# Patient Record
Sex: Male | Born: 1995 | Race: White | Hispanic: No | Marital: Single | Smoking: Never smoker
Health system: Southern US, Community
[De-identification: ages and names within clinical notes are randomized; demographics above are authoritative.]

## PROBLEM LIST (undated history)

## (undated) DIAGNOSIS — F840 Autistic disorder: Secondary | ICD-10-CM

## (undated) DIAGNOSIS — R569 Unspecified convulsions: Secondary | ICD-10-CM

## (undated) HISTORY — DX: Unspecified convulsions: R56.9

## (undated) HISTORY — DX: Autistic disorder: F84.0

---
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
## 2013-05-13 ENCOUNTER — Other Ambulatory Visit: Payer: Self-pay

## 2013-05-13 DIAGNOSIS — G40209 Localization-related (focal) (partial) symptomatic epilepsy and epileptic syndromes with complex partial seizures, not intractable, without status epilepticus: Secondary | ICD-10-CM

## 2013-05-13 DIAGNOSIS — F840 Autistic disorder: Secondary | ICD-10-CM

## 2013-05-13 DIAGNOSIS — G40309 Generalized idiopathic epilepsy and epileptic syndromes, not intractable, without status epilepticus: Secondary | ICD-10-CM

## 2013-05-13 MED ORDER — OXCARBAZEPINE 300 MG PO TABS: ORAL_TABLET | ORAL | Status: DC

## 2013-05-13 MED ORDER — ESCITALOPRAM OXALATE 10 MG PO TABS: 10.0000 mg | ORAL_TABLET | Freq: Every day | ORAL | Status: DC

## 2013-11-25 ENCOUNTER — Other Ambulatory Visit: Payer: Self-pay

## 2013-11-25 DIAGNOSIS — G40209 Localization-related (focal) (partial) symptomatic epilepsy and epileptic syndromes with complex partial seizures, not intractable, without status epilepticus: Secondary | ICD-10-CM

## 2013-11-25 DIAGNOSIS — G40309 Generalized idiopathic epilepsy and epileptic syndromes, not intractable, without status epilepticus: Secondary | ICD-10-CM

## 2013-11-25 DIAGNOSIS — F840 Autistic disorder: Secondary | ICD-10-CM

## 2013-11-25 MED ORDER — ESCITALOPRAM OXALATE 10 MG PO TABS: 10.0000 mg | ORAL_TABLET | Freq: Every day | ORAL | Status: DC

## 2013-11-25 MED ORDER — TRILEPTAL 300 MG PO TABS: ORAL_TABLET | ORAL | Status: DC

## 2013-11-25 NOTE — Telephone Encounter (Signed)
Christian Preston, I do not see a recall in for this pt.

## 2013-12-11 ENCOUNTER — Encounter: Payer: Self-pay | Admitting: Family

## 2013-12-11 DIAGNOSIS — G40209 Localization-related (focal) (partial) symptomatic epilepsy and epileptic syndromes with complex partial seizures, not intractable, without status epilepticus: Secondary | ICD-10-CM

## 2013-12-11 DIAGNOSIS — F840 Autistic disorder: Secondary | ICD-10-CM

## 2013-12-11 DIAGNOSIS — G40309 Generalized idiopathic epilepsy and epileptic syndromes, not intractable, without status epilepticus: Secondary | ICD-10-CM

## 2013-12-23 ENCOUNTER — Ambulatory Visit (INDEPENDENT_AMBULATORY_CARE_PROVIDER_SITE_OTHER): Payer: 59 | Admitting: Family

## 2013-12-23 ENCOUNTER — Encounter: Payer: Self-pay | Admitting: Family

## 2013-12-23 VITALS — BP 120/80 | HR 86 | Ht 69.75 in | Wt 187.4 lb

## 2013-12-23 DIAGNOSIS — R4681 Obsessive-compulsive behavior: Secondary | ICD-10-CM | POA: Insufficient documentation

## 2013-12-23 DIAGNOSIS — R451 Restlessness and agitation: Secondary | ICD-10-CM | POA: Insufficient documentation

## 2013-12-23 DIAGNOSIS — R4589 Other symptoms and signs involving emotional state: Secondary | ICD-10-CM

## 2013-12-23 DIAGNOSIS — F429 Obsessive-compulsive disorder, unspecified: Secondary | ICD-10-CM

## 2013-12-23 DIAGNOSIS — G40209 Localization-related (focal) (partial) symptomatic epilepsy and epileptic syndromes with complex partial seizures, not intractable, without status epilepticus: Secondary | ICD-10-CM

## 2013-12-23 DIAGNOSIS — F840 Autistic disorder: Secondary | ICD-10-CM

## 2013-12-23 DIAGNOSIS — G40309 Generalized idiopathic epilepsy and epileptic syndromes, not intractable, without status epilepticus: Secondary | ICD-10-CM

## 2013-12-23 DIAGNOSIS — IMO0001 Reserved for inherently not codable concepts without codable children: Secondary | ICD-10-CM | POA: Insufficient documentation

## 2013-12-23 DIAGNOSIS — Q999 Chromosomal abnormality, unspecified: Secondary | ICD-10-CM | POA: Insufficient documentation

## 2013-12-23 DIAGNOSIS — G319 Degenerative disease of nervous system, unspecified: Secondary | ICD-10-CM

## 2013-12-23 MED ORDER — ESCITALOPRAM OXALATE 10 MG PO TABS: 10.0000 mg | ORAL_TABLET | Freq: Every day | ORAL | Status: DC

## 2013-12-23 MED ORDER — DIAZEPAM 2 MG PO TABS: ORAL_TABLET | ORAL | Status: DC

## 2013-12-23 MED ORDER — TRILEPTAL 300 MG PO TABS: ORAL_TABLET | ORAL | Status: DC

## 2013-12-23 NOTE — Progress Notes (Signed)
Patient: Christian Preston MRN: [id number] Sex: male DOB: 03/28/1996  Provider: Elveria RisingGOODPASTURE, Jerrico Covello, NP [facility]  Note type: Routine return visit  History of Present Illness: Referral Source: Dr. Loyola MastMelissa Lowe History from: Mother Chief Complaint: Seizure/Autism  Christian Preston is a 18 y.o. 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 He as last seen October 28, 2012.   His last seizure occurred in June 2001, which was a prolonged seizure. At that time he was hospitalized at [facility] and placed on Trileptal. Christian Preston has tolerated this medication well.  Christian Preston has ongoing problems with low frustration tolerance, self-directed aggression, and limited communication. Since he was last seen, he has been enrolled in a day program at [employer]. The remainder of the time he is cared for by his parents and a CAP worker.   Christian Preston has been taking Lexapro for several years and his mother wonders if it is becoming less effective. She says that sometimes he is more restless and seems anxious. Mom says that he paces and sometimes will sweat when he is restless. She said that one day recently he was extremely restless, and that after having tried everything to calm him, she gave him a dose of her own Xanax ER out of desperation. She said that he became calmer and sat down and watched a video for awhile, then was generally calmer afterwards. Overall, his mood seems good and he has not shown aggression. He has bouts of the restlessness and when he becomes upset, Mom says that nothing works to help him to relax.  Christian Preston reportedly took Prozac in the past but had problems with increase in obsessive compulsive behavior and was changed to Lexapro.   Otherwise Christian Preston has been healthy and doing  well. He has a good appetite and eats a variety of foods. He does not have problems with sleep.   Review of Systems: 12 system review was unremarkable  Past Medical History  Diagnosis Date  . Seizures   . Autistic disorder, current or active state    Hospitalizations: no, Head Injury: no, Nervous System Infections: no, Immunizations up to date: yes Past Medical History Comments: see Hx.  Surgical History No past surgical history on file.  Family History family history includes Diabetes in his paternal grandmother; Heart Problems in his paternal grandmother. Family History is otherwise negative for migraines, seizures, cognitive impairment, blindness, deafness, birth defects, chromosomal disorder, autism.  Social History History   Social History  . Marital Status: Single    Spouse Name: N/A    Number of Children: N/A  . Years of Education: N/A   Social History Main Topics  . Smoking status: Never Smoker   . Smokeless tobacco: Never Used  . Alcohol Use: No  . Drug Use: No  . Sexual Activity: No   Other Topics Concern  . None   Social History Narrative  . None   Educational level: special education School Attending:[employer] Living with:  both parents  Hobbies/Interest: Playing with balls and swimming. School comments:  Christian Preston is doing well in school.  Physical Exam BP 120/80  Pulse 86  Ht 5' 9.75" (1.772 m)  Wt 187 lb 6.4 oz (85.004 kg)  BMI 27.07 kg/m2 General: alert, well developed, well nourished, in no acute distress,  right-handed, sandy hair, hazel  eyes Head: normocephalic, no dysmorphic features Ears, Nose and Throat: resistant to examination - unable to assess Neck: supple, full range of motion, no cranial or cervical bruits Respiratory: auscultation clear Cardiovascular: no murmurs, pulses are normal Musculoskeletal: no skeletal deformities or apparent scoliosis Skin: no rashes or neurocutaneous lesions, mild facial acne  Neurologic Exam   Mental Status: alert;  poor eye contact, no  verbalization, inability to follow commands, restless. He was resistant to invasions into his space but calmed fairly easily to his mother's soothing and allowed me to perform a basic examination. Cranial Nerves: visual fields are full to  objects brought in from the periphery; extraocular movements are full and conjugate; pupils are round reactive to light; funduscopic examination shows  positive red reflex is bilaterally; symmetric facial strength; midline tongue and uvula;he turns to localize sound bilaterally. Motor: Normal functional strength, tone, and mass; good fine motor movements; cannot test pronator drift. Sensory: withdrawal x4 Coordination: no tremor on reaching for objects   Gait and Station:  broad based but stable gait and station; balance can't be adequately tested Reflexes: symmetric and diminished bilaterally; no clonus; bilateral flexor plantar responses.  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 Mom was concerned today about episodes of restlessness with no apparent trigger. After discussion with Mom, I gave her a small prescription of Diazepam 2mg  to give him 1/2 tablet when restlessness was severe, to see if that would help him to be calmer. I asked her to let me know how that worked. I do not want him sedated, but simply calmer. If the restlessness persists or becomes more frequent, we will need to consider an alternate to the Lexapro. He remains seizure free on Trileptal and we will make no change in that medication. I will see him back in follow up in 1 year or sooner if needed.

## 2013-12-23 NOTE — Patient Instructions (Signed)
Continue Trileptal and Lexapro without change. I have given Christian Preston a prescription for Diazepam 2mg  to use when he becomes very restless and upset. Give him 1/2 tablet once per day when this occurs. Let me know how this works. I do not want him to become too sleepy or sedated.  Please plan to return for follow up in 1 year or sooner if needed.

## 2013-12-24 ENCOUNTER — Encounter: Payer: Self-pay | Admitting: Family

## 2014-01-09 ENCOUNTER — Other Ambulatory Visit: Payer: Self-pay | Admitting: Family

## 2014-04-01 ENCOUNTER — Telehealth: Payer: Self-pay | Admitting: Family

## 2014-04-01 MED ORDER — DIAZEPAM 1 MG/ML PO SOLN: ORAL | Status: DC

## 2014-04-01 NOTE — Telephone Encounter (Signed)
I reviewed your note and agree with this plan. 

## 2014-04-01 NOTE — Telephone Encounter (Signed)
Mom [name] left a message about [name]. She said that she feels that it is not strong enough and that he needs a larger dose. She also said that he needs a liquid formulation. I called Mom back and she said that when he was seen in May, she felt that a tablet could be hidden in a bite of food but when he is agitated, that is not as easy to do as she thought, and she has found that crushing the tablet and putting in something so that it becomes almost liquid is easier, so she wants liquid Diazepam. She also said that the the small dose we started him on has helped but not enough. He has been taking Diazepam 1 or 2mg , and she said that sometimes he is so agitated that it takes the edge off but does not calm him enough. She has only given it to him 8 times since the Rx was given in May, but thought about giving it more. She said that sometimes Christian Preston paces, sweats, hits himself and will throw things when he is upset. He has not been aggressive to people yet but Mom thinks that may occur because of the way he acts. She thinks that he has not tolerated inconsistent caregivers as that has changed, and since going to [employer], she thinks that some of the activities there are too stimulating for him. Mom said that he has gained considerable weight since going there because they eat out every day and that his appetite has increased dramatically. She said at home, if he finishes his food first, he begins trying to eat other family members food. She has been limiting his access to snacking and 2nd portions at home, and she wonders if that is causing some agitation as well, because he is frustrated and wants to eat. Mom said that he has been on Lexapro for a long time and she feels that it is not working as well now for anxiety.He took Prozac in the past but had increase in obsessive behaviors. I told Mom that I would change the Diazepam to liquid formulation and that the dose could increase slightly. She  said that she was interviewing workers in order to provide more consistency for [name]. I recommended trying to get things settled at home before making changes in Lexapro, and she agreed with that. I asked her to call me in a few weeks to let me know how he was doing. Mom agreed with these plans. Mom can be reached at [phone]. TG

## 2014-05-10 ENCOUNTER — Telehealth: Payer: Self-pay | Admitting: *Deleted

## 2014-05-10 DIAGNOSIS — G319 Degenerative disease of nervous system, unspecified: Principal | ICD-10-CM

## 2014-05-10 DIAGNOSIS — R451 Restlessness and agitation: Secondary | ICD-10-CM

## 2014-05-10 DIAGNOSIS — G40309 Generalized idiopathic epilepsy and epileptic syndromes, not intractable, without status epilepticus: Secondary | ICD-10-CM

## 2014-05-10 DIAGNOSIS — G40209 Localization-related (focal) (partial) symptomatic epilepsy and epileptic syndromes with complex partial seizures, not intractable, without status epilepticus: Secondary | ICD-10-CM

## 2014-05-10 DIAGNOSIS — F840 Autistic disorder: Secondary | ICD-10-CM

## 2014-05-10 DIAGNOSIS — R4681 Obsessive-compulsive behavior: Secondary | ICD-10-CM

## 2014-05-10 DIAGNOSIS — Q999 Chromosomal abnormality, unspecified: Secondary | ICD-10-CM

## 2014-05-10 DIAGNOSIS — IMO0001 Reserved for inherently not codable concepts without codable children: Secondary | ICD-10-CM

## 2014-05-10 NOTE — Telephone Encounter (Signed)
Sounds like a credible request for durable medical equipment.

## 2014-05-10 NOTE — Telephone Encounter (Signed)
Marylu Lund, mom, stated she needs a prescription for the pt to get a hospital bed. The mother can be reached at [phone].

## 2014-05-10 NOTE — Telephone Encounter (Signed)
I called and talked to Mom. She said that she was having trouble with [name] falling out of bed, and that she had tried bed rails that could be purchased in stores and that they simply fell out with him when he rolled out of bed because of his size. She also said that she had difficulty with hygiene and diaper changes particularly at night, and that his diaper frequently leaked. Despite use of incontinence pads provided, there was always soiling on his bed because of how active he was at night. She had tried mattress covers but they were generally not completely leak proof and therefore not very hygienic. Finally, he tends to snore heavily unless he has head of bed elevated slightly and that is difficult to accomplish in regular bed. For these reasons, she talked with medical supply store about getting hospital bed and they said they could provide one with signed order faxed to them. I told Mom that I would send order. Please fax signed order to [facility], fax number [phone].  TG

## 2014-05-11 NOTE — Telephone Encounter (Signed)
Order faxed as requested. TG

## 2014-05-27 NOTE — Telephone Encounter (Signed)
Christian LundJanet, mom, stated that the pt received the hospital bed. SThe mother said that the pt needs a Rx for a gel mattress overlay.You can send the Rx to [facility]. The mother can be reached at [phone].

## 2014-05-31 NOTE — Addendum Note (Signed)
Addended by: Princella IonGOODPASTURE, Roey Coopman P on: 05/31/2014 12:40 PM   Modules accepted: Orders

## 2014-05-31 NOTE — Telephone Encounter (Signed)
Order will be faxed to [facility] as requested. TG

## 2014-06-23 ENCOUNTER — Other Ambulatory Visit: Payer: Self-pay | Admitting: Family

## 2014-07-29 ENCOUNTER — Other Ambulatory Visit: Payer: Self-pay | Admitting: Family

## 2014-12-14 ENCOUNTER — Other Ambulatory Visit: Payer: Self-pay | Admitting: Family

## 2014-12-30 ENCOUNTER — Encounter: Payer: Self-pay | Admitting: Family

## 2015-01-30 ENCOUNTER — Other Ambulatory Visit: Payer: Self-pay | Admitting: Family

## 2015-01-31 ENCOUNTER — Ambulatory Visit: Payer: Medicaid Other | Admitting: Family

## 2015-02-02 ENCOUNTER — Ambulatory Visit (INDEPENDENT_AMBULATORY_CARE_PROVIDER_SITE_OTHER): Payer: 59 | Admitting: Family

## 2015-02-02 ENCOUNTER — Encounter: Payer: Self-pay | Admitting: Family

## 2015-02-02 VITALS — BP 130/80 | HR 90 | Wt 213.0 lb

## 2015-02-02 DIAGNOSIS — E669 Obesity, unspecified: Secondary | ICD-10-CM | POA: Diagnosis not present

## 2015-02-02 DIAGNOSIS — G40209 Localization-related (focal) (partial) symptomatic epilepsy and epileptic syndromes with complex partial seizures, not intractable, without status epilepticus: Secondary | ICD-10-CM

## 2015-02-02 DIAGNOSIS — F840 Autistic disorder: Secondary | ICD-10-CM | POA: Diagnosis not present

## 2015-02-02 DIAGNOSIS — R4681 Obsessive-compulsive behavior: Secondary | ICD-10-CM

## 2015-02-02 DIAGNOSIS — G319 Degenerative disease of nervous system, unspecified: Secondary | ICD-10-CM

## 2015-02-02 DIAGNOSIS — Q999 Chromosomal abnormality, unspecified: Secondary | ICD-10-CM

## 2015-02-02 DIAGNOSIS — R451 Restlessness and agitation: Secondary | ICD-10-CM

## 2015-02-02 DIAGNOSIS — G40309 Generalized idiopathic epilepsy and epileptic syndromes, not intractable, without status epilepticus: Secondary | ICD-10-CM

## 2015-02-02 DIAGNOSIS — IMO0001 Reserved for inherently not codable concepts without codable children: Secondary | ICD-10-CM

## 2015-02-02 NOTE — Progress Notes (Signed)
Patient: Christian Preston MRN: [id number] Sex: male DOB: 11-17-1995  Provider: Elveria Rising, NP Location of Care: [facility]  Note type: Routine return visit  History of Present Illness: Referral Source: Dr. Loyola Mast History from: his mother Chief Complaint: Follow-Up Epilepsy  Christian Preston is a 19 y.o. 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 He was last seen Dec 23, 2013.  His last seizure occurred in June 2001, which was a prolonged seizure. At that time he was hospitalized at [facility] and placed on Trileptal. Christian Preston has tolerated this medication well.  Christian Preston has ongoing problems with low frustration tolerance, self-directed aggression, and limited communication. He attends a day program at [employer]. The remainder of the time he is cared for by his parents and a CAP worker.   Christian Preston takes Lexapro for anxiety, but has episodes of being restless and having increased anxiety. Mom says that he paces and will sweat when he is restless. We have prescribed Diazepam for Mom to give him when he is agitated or restless, and this works to help him to be calmer.   Mom's concern today is that Christian Preston has a big appetite and has gained 25 lbs since he was last seen. Mom said that she knew that he was gaining because she had to buy new clothes for him, but had not realized that he had gained that much.  She felt that some of his weight gain is related to eating lunch out every day at the day program. Mom also says that Christian Preston becomes increasingly agitated at meal times, grabbing foods and eating very rapidly. Mom had to remove him from the family table because he would grab other family members' food and be more agitated. She feeds him separately in a separate room and then engage  him in an activity while the family has a meal.  Christian Preston sleeps well at night and has been otherwise healthy since he was last seen. Mom has no other health concerns today about Christian Preston.   Review of Systems: Please see the HPI for neurologic and other pertinent review of systems. Otherwise, the following systems are noncontributory including constitutional, eyes, ears, nose and throat, cardiovascular, respiratory, gastrointestinal, genitourinary, musculoskeletal, skin, endocrine, hematologic/lymph, allergic/immunologic and psychiatric.   Past Medical History  Diagnosis Date  . Seizures   . Autistic disorder, current or active state    Hospitalizations: No., Head Injury: No., Nervous System Infections: No., Immunizations up to date: Yes.   Past Medical History Comments: See history   Surgical History History reviewed. No pertinent past surgical history.  Family History family history includes Diabetes in his paternal grandmother; Heart Problems in his paternal grandmother. Family History is otherwise negative for migraines, seizures, cognitive impairment, blindness, deafness, birth defects, chromosomal disorder, autism.  Social History History   Social History  . Marital Status: Single    Spouse Name: N/A  . Number of Children: N/A  . Years of Education: N/A   Social History Main Topics  . Smoking status: Never Smoker   . Smokeless tobacco: Never Used  . Alcohol Use: No  . Drug Use: No  . Sexual Activity: No   Other Topics Concern  . None   Social History Narrative   Educational level: Day [employer] School Attending: [employer] Living with:  both parents  Hobbies/Interest: He enjoys swimming and riding.  School comments:  Zadan attends a day program 5 days a week. He is doing great.  Allergies Allergies  Allergen Reactions  . Depakote [Divalproex Sodium] Other (See Comments)    Toxicity and worsening seizures - occurred in childhood - prior to starting Trileptal in  2001  . Prozac [Fluoxetine Hcl] Other (See Comments)    Increase in obsessive compulsive behavior  . Carbamazepine And Analogs     Other reaction(s): Unknown  . Chicken Allergy     Other reaction(s): Unknown  . Eggs Or Egg-Derived Products     Other reaction(s): Unknown    Physical Exam BP 130/80 mmHg  Pulse 90  Wt 213 lb (96.616 kg) General: alert, well developed, well nourished, in no acute distress,right-handed, sandy hair, hazel eyes Head: normocephalic, no dysmorphic features Ears, Nose and Throat: resistant to examination - unable to assess Neck: supple, full range of motion, no cranial or cervical bruits Respiratory: auscultation clear Cardiovascular: no murmurs, pulses are normal Musculoskeletal: no skeletal deformities or apparent scoliosis Skin: no rashes or neurocutaneous lesions, mild facial acne  Neurologic Exam  Mental Status: alert; poor eye contact, no verbalization, inability to follow commands, restless. He was resistant to invasions into his space but calmed fairly easily to his mother's soothing and allowed me to perform a basic examination. Cranial Nerves: visual fields are full to objects brought in from the periphery; extraocular movements are full and conjugate; pupils are round reactive to light; funduscopic examination shows positive red reflex is bilaterally; symmetric facial strength; midline tongue and uvula;he turns to localize sound bilaterally. Motor: Normal functional strength, tone, and mass; good fine motor movements; cannot test pronator drift. Sensory: withdrawal x4 Coordination: no tremor on reaching for objects  Gait and Station: broad based but stable gait and station; balance can't be adequately tested Reflexes: symmetric and diminished bilaterally; no clonus; bilateral flexor plantar responses.  Impression 1. Complex partial seizures with secondary generalization 2. Autism 3. Translocation of chromosomes 2 and 5 without apparent  deletions 4. Cortical atrophy with increased subarachnoid spaces 5. Anxiety 6. Episodic agitation and restlessness 7. Weight gain 8. Obesity   Recommendations for plan of care The patient's previous [facility] records were reviewed. Christian Preston has neither had nor required imaging or lab studies since the last visit. 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 He remains seizure free on Trileptal and we will make no change in that medication. I talked with Mom about his weight gain and we discussed ways to reduce his portions, provide healthier choices at meal times as well as limit snacks. He is fairly active with his pacing behavior and Mom does not feel that she can get him to do any other exercise. I will otherwise see him back in follow up in 1 year or sooner if needed.   The medication list was reviewed and reconciled.  No changes were made in the prescribed medications today.  A complete medication list was provided to the patient's mother.  Total time spent with the patient was 25 minutes, of which 50% or more was spent in counseling and coordination of care.

## 2015-02-03 NOTE — Patient Instructions (Signed)
Continue Christian Preston's medication without change. Let me know if he has any seizures.  Work on providing him with healthy foods at mealtimes and limiting his portions and snacks.  Try giving him a Diazepam prior to meal time to see if it helps him to be calmer when he eats. Let me know how this works.  Otherwise, plan to return for follow up in 1 year or sooner if needed.

## 2015-02-15 ENCOUNTER — Other Ambulatory Visit: Payer: Self-pay | Admitting: Family

## 2015-02-16 DIAGNOSIS — Z029 Encounter for administrative examinations, unspecified: Secondary | ICD-10-CM

## 2015-02-25 ENCOUNTER — Other Ambulatory Visit: Payer: Self-pay | Admitting: Family

## 2015-02-28 ENCOUNTER — Other Ambulatory Visit: Payer: Self-pay | Admitting: Family

## 2015-02-28 MED ORDER — DIAZEPAM 1 MG/ML PO SOLN: ORAL | Status: DC

## 2015-02-28 NOTE — Telephone Encounter (Signed)
Previous Rx did not print. TG 

## 2015-03-08 ENCOUNTER — Other Ambulatory Visit: Payer: Self-pay | Admitting: Family

## 2015-06-23 ENCOUNTER — Telehealth: Payer: Self-pay | Admitting: *Deleted

## 2015-06-23 NOTE — Telephone Encounter (Signed)
Mom called and left a voicemail stating that she would like to talk to Inetta Fermoina to ask her more about [name]'s diagnosis for [facility] Services.   CB: [phone]

## 2015-06-23 NOTE — Telephone Encounter (Signed)
I called Mom and she said that she needed a letter with [name]'s diagnoses faxed to Marcy [facility] Gentry [facility], fax# [phone]. I wrote a letter and faxed it as requested. TG

## 2015-07-05 ENCOUNTER — Telehealth: Payer: Self-pay

## 2015-07-05 NOTE — Telephone Encounter (Signed)
Marylu LundJanet, mom, lvm stating that medicaid is refusing to cover patient's Trileptal. She said that she would like to switch it to generic. CB# [phone] Patient last seen by Inetta Fermoina on 02-02-15. Recall set for 02-02-16.  I called mother and sounds like this is going to need a PA. Patient has enough medication to last him for the next four days. I let her know that we will contact her when we get an answer from the insurance company regarding a PA. She expressed understanding. I confirmed medication, dosing, and pharmacy.

## 2015-07-05 NOTE — Telephone Encounter (Signed)
Trileptal was approved on 07/04/15. Please let Mom know and if she continues to have difficulties at pharmacy to let us know. Thanks, Inetta Fermoina

## 2015-07-06 NOTE — Telephone Encounter (Signed)
I lvm for mother giving her the below information. 

## 2015-09-07 ENCOUNTER — Other Ambulatory Visit: Payer: Self-pay | Admitting: Pediatrics

## 2015-09-12 ENCOUNTER — Other Ambulatory Visit: Payer: Self-pay | Admitting: Family

## 2016-03-21 ENCOUNTER — Other Ambulatory Visit: Payer: Self-pay | Admitting: Family

## 2016-03-21 ENCOUNTER — Encounter: Payer: Self-pay | Admitting: Family

## 2016-03-28 ENCOUNTER — Ambulatory Visit (INDEPENDENT_AMBULATORY_CARE_PROVIDER_SITE_OTHER): Payer: 59 | Admitting: Family

## 2016-03-28 ENCOUNTER — Encounter: Payer: Self-pay | Admitting: Family

## 2016-03-28 VITALS — BP 126/76 | HR 86 | Ht 69.4 in | Wt 208.4 lb

## 2016-03-28 DIAGNOSIS — R4681 Obsessive-compulsive behavior: Secondary | ICD-10-CM

## 2016-03-28 DIAGNOSIS — E669 Obesity, unspecified: Secondary | ICD-10-CM | POA: Diagnosis not present

## 2016-03-28 DIAGNOSIS — G40309 Generalized idiopathic epilepsy and epileptic syndromes, not intractable, without status epilepticus: Secondary | ICD-10-CM | POA: Diagnosis not present

## 2016-03-28 DIAGNOSIS — F840 Autistic disorder: Secondary | ICD-10-CM | POA: Diagnosis not present

## 2016-03-28 DIAGNOSIS — G319 Degenerative disease of nervous system, unspecified: Secondary | ICD-10-CM | POA: Diagnosis not present

## 2016-03-28 DIAGNOSIS — IMO0001 Reserved for inherently not codable concepts without codable children: Secondary | ICD-10-CM

## 2016-03-28 DIAGNOSIS — Q999 Chromosomal abnormality, unspecified: Secondary | ICD-10-CM

## 2016-03-28 DIAGNOSIS — R451 Restlessness and agitation: Secondary | ICD-10-CM

## 2016-03-28 DIAGNOSIS — G40209 Localization-related (focal) (partial) symptomatic epilepsy and epileptic syndromes with complex partial seizures, not intractable, without status epilepticus: Secondary | ICD-10-CM

## 2016-03-28 MED ORDER — ESCITALOPRAM OXALATE 10 MG PO TABS: 10.0000 mg | ORAL_TABLET | Freq: Every day | ORAL | 5 refills | Status: DC

## 2016-03-28 MED ORDER — DIAZEPAM 1 MG/ML PO SOLN: ORAL | 5 refills | Status: DC

## 2016-03-28 MED ORDER — TRILEPTAL 300 MG PO TABS: ORAL_TABLET | ORAL | 5 refills | Status: DC

## 2016-03-28 NOTE — Progress Notes (Signed)
Patient: Christian Preston MRN: [id number] Sex: male DOB: 08/16/1996  Provider: Elveria Risingina Bell Cai, NP Location of Care: [facility]  Note type: Routine return visit  History of Present Illness: Referral Source: Christian MastMelissa Lowe, MD History from: mother and [facility] chart Chief Complaint:Epilepsy  Christian Preston is a 20 y.o. 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 Christian Preston was last seen February 02, 2015.  His last seizure occurred in June 2000, which was a prolonged seizure. At that time he was hospitalized at [facility] and placed on Christian Preston. Christian Preston has tolerated this medication well.  Christian Preston has ongoing problems with low frustration tolerance, self-directed aggression, and limited communication. When he becomes very agitated, a low dose of Christian [facility] will help him to be calm without causing sleepiness. He also takes Christian Preston for anxiety.   Christian Preston attends a day program at [facility]. The remainder of the time he is cared for by his parents and a CAP worker.   When he was last seen, Christian Preston had gained a considerable amount of weight. Mom has been working to reduce his weight by offering healthier foods, as well as limiting snacks and extra portions. This has worked well until this summer when he has been eating out more at lunch with his day program, and tends to eat higher calorie foods when at a restaurant. Mom feels that this will improve in the fall when she can pack his lunches again.   Christian Preston sleeps well at night and has been otherwise healthy since he was last seen. Mom has no other health concerns today about Christian Preston.   Review of Systems: Please see the HPI for neurologic and other pertinent review of systems. Otherwise, the following systems are noncontributory including  constitutional, eyes, ears, nose and throat, cardiovascular, respiratory, gastrointestinal, genitourinary, musculoskeletal, skin, endocrine, hematologic/lymph, allergic/immunologic and psychiatric.   Past Medical History:  Diagnosis Date  . Autistic disorder, current or active state   . Seizures (HCC)    Hospitalizations: No., Head Injury: No., Nervous System Infections: No., Immunizations up to date: Yes.   Past Medical History Comments: See history  Surgical History No past surgical history on file.  Family History family history includes Diabetes in his paternal grandmother; Heart Problems in his paternal grandmother. Family History is otherwise negative for migraines, seizures, cognitive impairment, blindness, deafness, birth defects, chromosomal disorder, autism.  Social History Social History   Social History  . Marital status: Single    Spouse name: N/A  . Number of children: N/A  . Years of education: N/A   Social History Main Topics  . Smoking status: Never Smoker  . Smokeless tobacco: Never Used  . Alcohol use No  . Drug use: No  . Sexual activity: No   Other Topics Concern  . None   Social History Narrative   Christian [facility] attends [facility]. He enjoys movies and music. He lives with his parents.     Allergies Allergies  Allergen Reactions  . Depakote [Divalproex Sodium] Other (See Comments)    Toxicity and worsening seizures - occurred in childhood - prior to starting [facility] in 2001  . Prozac [Fluoxetine Hcl] Other (See Comments)    Increase in obsessive compulsive behavior  . Carbamazepine And Analogs     Other reaction(s): Unknown  . Chicken Allergy     Other reaction(s):  Unknown  . Eggs Or Egg-Derived Products     Other reaction(s): Unknown    Physical Exam BP 126/76   Pulse 86   Ht 5' 9.4" (1.763 m)   Wt 208 lb 6.4 oz (94.5 kg)   BMI 30.42 kg/m  General: alert, well developed, well nourished, in no acute distress,right-handed,  sandy hair, hazel eyes Head: normocephalic, no dysmorphic features Ears, Nose and Throat: resistant to examination - unable to assess Neck: supple, full range of motion, no cranial or cervical bruits Respiratory: auscultation clear Cardiovascular: no murmurs, pulses are normal Musculoskeletal: no skeletal deformities or apparent scoliosis Skin: no rashes or neurocutaneous lesions, mild facial acne  Neurologic Exam  Mental Status: alert; poor eye contact, no verbalization, inability to follow commands, restless. He was resistant to invasions into his space but calmed fairly easily to his mother's soothing and allowed me to perform a basic examination. Cranial Nerves: visual fields are full to objects brought in from the periphery; extraocular movements are full and conjugate; pupils are round reactive to light; funduscopic examination shows positive red reflex is bilaterally; symmetric facial strength; midline tongue and uvula;he turns to localize sound bilaterally. Motor: Normal functional strength, tone, and mass; good fine motor movements; cannot test pronator drift. Sensory: withdrawal x4 Coordination: no tremor on reaching for objects  Gait and Station: broad based but stable gait and station; balance can't be adequately tested Reflexes: symmetric and diminished bilaterally; no clonus; bilateral flexor plantar responses.  Impression 1. Complex partial seizures with secondary generalization 2. Autism 3. 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 Cortical atrophy with increased subarachnoid spaces 5. Anxiety 6. Episodic agitation and restlessness 7. Obesity   Recommendations for plan of care The patient's previous [facility] records were reviewed. Christian [facility] has neither had nor required imaging or lab studies since the last visit.  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 He remains seizure free on [facility] and we will make no change in that medication. I talked with Mom about his weight gain and commended her for making changes in his diet to help him lose some weight. I will see Christian Macho back in follow up in 1 year or sooner if needed.   Wt Readings from Last 3 Encounters:  03/28/16 208 lb 6.4 oz (94.5 kg)  02/02/15 213 lb (96.6 kg)  12/23/13 187 lb 6.4 oz (85 kg) (87 %, Z= 1.11)*   * Growth percentiles are based on CDC 2-20 Years data.    The medication list was reviewed and reconciled.  No changes were made in the prescribed medications today.  A complete medication list was provided to the patient's mother.    Medication List       Accurate as of 03/28/16 10:06 AM. Always use your most recent med list.          [facility] 1 MG/ML solution Commonly known as:  VALIUM GIVE ONCE DAILY AS NEEDED FOR AGITATION   escitalopram 10 MG tablet Commonly known as:  [facility] TAKE 1 TABLET BY MOUTH EVERY DAY   [facility] 300 MG tablet Generic drug:  Oxcarbazepine TAKE 1.5 TABLETS BY MOUTH TWICE A DAY       Total time spent with the patient was 20 minutes, of which 50% or more was spent in  counseling and coordination of care.   Christian Risingina Lyna Laningham

## 2016-03-28 NOTE — Patient Instructions (Signed)
Continue giving [name]'s medication has you have been giving it. Let me know if he has any seizures.   Continue to work on helping him to limit snacks and extra portions, and eating healthier foods in general.  Please plan on returning for follow up in 1 year or sooner if needed.

## 2016-06-11 ENCOUNTER — Telehealth (INDEPENDENT_AMBULATORY_CARE_PROVIDER_SITE_OTHER): Payer: Self-pay

## 2016-06-11 NOTE — Telephone Encounter (Signed)
Christian Preston, Christian Preston,  lvm stating that she needs a letter written so that Christian Preston will be able to to continue receiving services. She said that he is at risk of losing services. CB#  [phone]

## 2016-06-11 NOTE — Telephone Encounter (Signed)
The letter is written and in Dr Hovnanian EnterprisesHickling's office for signature. I will call Mom when the letter has been signed. TG

## 2016-06-11 NOTE — Telephone Encounter (Signed)
I called and talked to Mom. She said that [name]'s CAP services were being cut and that she needed a letter of support for keeping his services as he has been receiving them. I told Mom that I would write a letter and have Dr Sharene SkeansHickling sign it tomorrow when he returns to the office. I will let Mom know when the letter is ready to be picked up. She agreed with this plan. TG

## 2016-06-12 NOTE — Telephone Encounter (Signed)
The letter was signed and returned to your desk, thank you.

## 2016-06-29 ENCOUNTER — Telehealth (INDEPENDENT_AMBULATORY_CARE_PROVIDER_SITE_OTHER): Payer: Self-pay | Admitting: Family
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
## 2016-07-04 NOTE — Telephone Encounter (Signed)
I called Mom and let her know that Dr Sharene SkeansHickling has been out of the office but that I will have him sign the letter today and that I will put the letter in the mail today. Mom agreed with this plan. TG

## 2016-07-04 NOTE — Telephone Encounter (Signed)
Christian LundJanet lvm inquiring about the status of the letter. She can be reached at: [phone].

## 2016-08-30 ENCOUNTER — Telehealth (INDEPENDENT_AMBULATORY_CARE_PROVIDER_SITE_OTHER): Payer: Self-pay | Admitting: Family

## 2016-08-30 DIAGNOSIS — R451 Restlessness and agitation: Secondary | ICD-10-CM

## 2016-08-30 DIAGNOSIS — Q999 Chromosomal abnormality, unspecified: Secondary | ICD-10-CM

## 2016-08-30 DIAGNOSIS — IMO0001 Reserved for inherently not codable concepts without codable children: Secondary | ICD-10-CM

## 2016-08-30 DIAGNOSIS — F840 Autistic disorder: Secondary | ICD-10-CM

## 2016-08-30 DIAGNOSIS — G319 Degenerative disease of nervous system, unspecified: Principal | ICD-10-CM

## 2016-08-30 DIAGNOSIS — R4681 Obsessive-compulsive behavior: Secondary | ICD-10-CM

## 2016-08-30 NOTE — Telephone Encounter (Signed)
Christian [facility] called about [facility]. She said that she was told that [facility] needs [facility] to [facility] to prove need for services and asked where she could have that done. I told her that I would refer Christian Preston to [employer] [facility] in this office. She agreed with that plan. TG

## 2016-09-04 ENCOUNTER — Ambulatory Visit (INDEPENDENT_AMBULATORY_CARE_PROVIDER_SITE_OTHER): Payer: 59 | Admitting: Psychology

## 2016-09-04 DIAGNOSIS — F73 Profound intellectual disabilities: Secondary | ICD-10-CM | POA: Diagnosis not present

## 2016-09-12 ENCOUNTER — Telehealth (INDEPENDENT_AMBULATORY_CARE_PROVIDER_SITE_OTHER): Payer: Self-pay | Admitting: *Deleted

## 2016-09-12 NOTE — Telephone Encounter (Signed)
Contacted Rob Orson SlickHarmon requesting this patient's evaluation.

## 2016-09-12 NOTE — Telephone Encounter (Signed)
  Who's calling (name and relationship to patient) : Marylu LundJanet, Mother  Best contact number: [phone]  Provider they see: Lucky Cowboyob Harmon  Reason for call: Mother called in needing to get a copy of the Evaluation that was performed on 1.16.2018 for her meeting on 1.30.2018.  Please call mom ASAP so she can pick up report.     PRESCRIPTION REFILL ONLY  Name of prescription:  Pharmacy:

## 2016-09-12 NOTE — Telephone Encounter (Signed)
Thank you for contacting Rob.  I spoke with mother and told her that we were working on this issue.

## 2016-09-13 ENCOUNTER — Telehealth (INDEPENDENT_AMBULATORY_CARE_PROVIDER_SITE_OTHER): Payer: Self-pay | Admitting: Psychology

## 2016-09-13 ENCOUNTER — Telehealth (INDEPENDENT_AMBULATORY_CARE_PROVIDER_SITE_OTHER): Payer: Self-pay

## 2016-09-13 NOTE — Telephone Encounter (Signed)
Marylu LundJanet, mom, called stating that she has a conference call with [employer] on 1.31.18 @ 1 pm. The call will determine pt's eligibility for services. Marylu LundJanet wants to know if Inetta Fermoina or Dr. Sharene SkeansHickling would be willing to join in on the call. I let her know that Inetta Fermoina is out of the office until next week and will return mother's call upon her return. Mother expressed understanding. CB# [phone]

## 2016-09-13 NOTE — Telephone Encounter (Signed)
Called patient's mother and let her know that I spoke to [employer] Harmon and he will have the evaluation ready to me by tomorrow afternoon. I let her know that I would call her and advise her once I had the report in hand so she could pick it up. She verbalized understanding and agreement.

## 2016-09-13 NOTE — Telephone Encounter (Signed)
°  Who's calling (name and relationship to patient) : Marylu LundJanet (mom) Best contact number: [phone] Provider they see: Harmom Reason for call: Mom stated that Dr Orson SlickHarmon was going to send evaluation form to her. She has not received it.  She was him on 10/06/15.  Please contact him or ask him to call her about the form   PRESCRIPTION REFILL ONLY  Name of prescription:  Pharmacy:

## 2016-09-18 NOTE — Telephone Encounter (Signed)
I called Mom and explained that I have been out of the office for a few days. I told her that I would not mind to participate in the phone conference but that I have limited time at the time that the conference is scheduled, as I have other commitments that begin at 1:30pm. Mom agreed and said that she would call back with the information as to how to call into the conference call. TG

## 2016-09-18 NOTE — Telephone Encounter (Signed)
Marylu LundJanet, mom, lvm stating that number is [phone] Access Code: [id number]. Marylu LundJanet asked that Hollyina e-mail her and she will e-mail the information. Her e-mail is : [email or web address].

## 2016-09-19 NOTE — Telephone Encounter (Signed)
I called in and participated in the conference call for 25 minutes. TG

## 2016-10-20 ENCOUNTER — Other Ambulatory Visit: Payer: Self-pay | Admitting: Family

## 2016-10-20 DIAGNOSIS — R451 Restlessness and agitation: Secondary | ICD-10-CM

## 2016-10-20 DIAGNOSIS — R4681 Obsessive-compulsive behavior: Secondary | ICD-10-CM

## 2016-10-29 ENCOUNTER — Ambulatory Visit: Payer: 59 | Admitting: Psychology

## 2016-10-29 ENCOUNTER — Ambulatory Visit (INDEPENDENT_AMBULATORY_CARE_PROVIDER_SITE_OTHER): Payer: 59 | Admitting: Psychology

## 2016-10-29 DIAGNOSIS — F79 Unspecified intellectual disabilities: Secondary | ICD-10-CM

## 2016-10-29 DIAGNOSIS — F840 Autistic disorder: Secondary | ICD-10-CM | POA: Diagnosis not present

## 2016-11-17 ENCOUNTER — Other Ambulatory Visit: Payer: Self-pay | Admitting: Family

## 2016-11-17 DIAGNOSIS — G40309 Generalized idiopathic epilepsy and epileptic syndromes, not intractable, without status epilepticus: Secondary | ICD-10-CM

## 2016-11-17 DIAGNOSIS — G40209 Localization-related (focal) (partial) symptomatic epilepsy and epileptic syndromes with complex partial seizures, not intractable, without status epilepticus: Secondary | ICD-10-CM

## 2016-11-22 NOTE — Progress Notes (Signed)
[facility] [address] [place] [address] [phone]  CONFIDENTIAL INFORMATION: This report contains confidential information that should not be disclosed to any third party without prior written permission from the client or client's parent/guardian if the client is a minor.  PSYCHOLOGICAL EVALUATION  Name:  Elery Cadenhead Casados    Date of Evaluation:  09/04/2016 Date of Birth:  1995-11-01    Age:  20 years, 2 months  REFERRAL INFORMATION:  [name] was referred for a psychological evaluation by his parent and Elveria Rising, nurse practitioner with [facility] to assess and update information regarding his current level of developmental functioning. [name] has a history of severe developmental delays associated with a chromosomal disorder, neurological atrophy, and a previous diagnosis of autism. His mother is [name]'s primary caregiver and estimates that he is functioning currently at the level of a toddler or preschooler. [name] is described as a loving child with minimal communication skills and an extremely limited vocabulary of approximately 5-10 words that he typically uses in a rote manner to get someone's attention. He requires close supervision and needs assistance with common activities of daily living.  TESTS ADMINISTERED: Wechsler Intelligence Scale for Children - Fifth Edition (WISC-V) - attempted but discontinued Wechsler Primary and Preschool Scale of Intelligence - Fourth Edition (WPPSI-IV) - attempted but discontinued Bayley Scales of Infant Development - Third Edition (Bayley-III) Adaptive Behavior Assessment System-3rd Edition (ABAS-3) File Review  BEHAVIORAL OBSERVATIONS:    During his evaluations, [name] is noncommunicative and uses jabber and word approximations only on a few occasions. He is cooperative with a few requests to complete tasks, once tasks on more on his delayed functional level. He is anxious when his mother first  leaves the room. He briefly engaged in play with toys and other manipulatives involved with the assessment, but avoids most pictures and verbal tasks. No aggressive or self-injurious behavior was observed during his evaluations; however, he did attempt to leave the room on one occasion when presented with pictures later in the assessment. He heard his mother or sibling outside the room and apparently wanted to rejoin them. He is more settled and plays with items involved with tasks that are within his ability level, which is at an infant-toddler level, once attempts to administer age appropriate tasks are discontinued.    TEST RESULTS AND INTERPRETATION:  COGNITIVE:  Several tasks on the WISC-V were presented to [facility] in an attempt to assess his cognitive skills with an instrument that is appropriate for his chronological age. He could not complete any items on the WISC-V; therefore, items from the WPPSI-IV and the Bayley Scales of Infant Development - Third Edition (Bayley-III) were administered to assess overall cognitive functioning and to estimate his actual level of functioning.  He also was unable to complete tasks included with the WPPSI-IV and struggled with many tasks on the [place] as well. Standardized scores could not be assigned for [name] because his chronological age far exceeds the age range of the Bayley-III; however, an age equivalent was obtained to provide an approximate measure of his current level of functioning. (Scaled scores have an average score of 10 and a standard deviation of 3 with the average range falling between 7 and 13.  Standard scores have an average score of 100 and a standard deviation of 15 with the average range falling between 85 and 115.)  Cognitive Composite Standard Score:  N/A Age Equivalent:  20 months  Results of the Bayley-III and other  standardized assessment tools indicate [name]'s cognitive skills are severely delayed for his age. [name] primarily was  successful with nonverbal tasks and struggled even with most receptive language tasks. He typically would point randomly to pictures on a page in response to a request to point to a particular item. He did not complete any expressive language tasks requested of him. He did not answer any direct questions and made utterances only when upset or to get someone's attention. [name] was successful with placing all pegs in a pegboard and using a rod to obtain a toy out of his reach. He completed the formboards, but struggled with completing two-piece puzzles of a ball and ice cream cone. He was successful with completing a three-piece formboard in both its regular and reversed presentation. He did not match colors, which required him to place a colored disc on top of a corresponding color of a pictured crayon. He was unable to group objects based on varying concepts, such as size (big/little), color, or weight (heavy). He did not attempt to imitate a two-step action with repeated demonstrations and requests. He did not engage in any relational play activities, nor demonstrate the ability for representational, imaginary, or multischeme (combination of related steps with a toy) play yet.  His highest level of success consisted of completing a nine-piece formboard.  ADAPTIVE: The Adaptive Behavior Assessment System-3 measures an individual's adaptive skills or daily living skills and activities in the home, school, and community. According to responses from [name]'s mother, the results of the ABAS-3 indicate [name]'s adaptive skills are severely delayed and fall in the extremely low range overall. According to his mother, [name]'s adaptive skills fall in the extremely low range in all domains, which include the conceptual, social, and practical skill domains. His best score came in the social domain, but these skills also are significantly delayed.    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 [name]'s mother, teachers, and other caregivers may want to help [name] address his difficulty with developmental tasks by providing him with tasks within his ability level then slowly increasing the difficulty level. He requires extended time along with manipulatives, visual cues, demonstrations, and frequent positive feedback regarding his performance on preacademic tasks. [name] also may benefit from regular physical movement, frequent breaks to move around the room briefly, and other strategies to help [name] achieve optimal performance on tasks in the classroom.  [name]'s parents and teachers should monitor his language processing ability and expressive language skills closely in cooperation with a speech and language therapist with further evaluation of his developmental progress if a significant gain or decline is noted with learning new skills and concepts.  [name]'s parents may want to consult with the physicians involved in [name]'s care to discuss options for treatment and other resources in the community that may benefit [name] and improve his developmental skills. Additional resources could be found at the websites for [facility] and the [facility].   ___________________________________ Normajean Glasgow, Montez Hageman., MA, LPA  Psychologist

## 2016-11-28 ENCOUNTER — Ambulatory Visit (INDEPENDENT_AMBULATORY_CARE_PROVIDER_SITE_OTHER): Payer: 59 | Admitting: Psychology

## 2016-11-28 DIAGNOSIS — F840 Autistic disorder: Secondary | ICD-10-CM

## 2016-11-28 DIAGNOSIS — F79 Unspecified intellectual disabilities: Secondary | ICD-10-CM

## 2016-12-10 ENCOUNTER — Ambulatory Visit (INDEPENDENT_AMBULATORY_CARE_PROVIDER_SITE_OTHER): Payer: 59 | Admitting: Psychology

## 2016-12-10 DIAGNOSIS — F73 Profound intellectual disabilities: Secondary | ICD-10-CM

## 2016-12-10 DIAGNOSIS — F840 Autistic disorder: Secondary | ICD-10-CM

## 2017-01-04 ENCOUNTER — Ambulatory Visit (INDEPENDENT_AMBULATORY_CARE_PROVIDER_SITE_OTHER): Payer: 59 | Admitting: Psychology

## 2017-01-04 DIAGNOSIS — F78 Other intellectual disabilities: Secondary | ICD-10-CM

## 2017-01-04 DIAGNOSIS — F840 Autistic disorder: Secondary | ICD-10-CM | POA: Diagnosis not present

## 2017-01-08 ENCOUNTER — Telehealth (INDEPENDENT_AMBULATORY_CARE_PROVIDER_SITE_OTHER): Payer: Self-pay | Admitting: *Deleted

## 2017-01-08 NOTE — Telephone Encounter (Signed)
Mother, Jason FilaJanet Boller, brought [facility] for completion. 3 pages, front & back.  Mother requests to be called when completed to be picked up at [phone]  Mother has paid the $20.00 fee for [facility] completion.  Forms have been placed on Tina's desk.

## 2017-01-09 NOTE — Telephone Encounter (Signed)
I called Mom and let her know that the FMLA form was ready to be picked up. TG

## 2017-04-17 ENCOUNTER — Other Ambulatory Visit: Payer: Self-pay | Admitting: Family

## 2017-04-17 DIAGNOSIS — R4681 Obsessive-compulsive behavior: Secondary | ICD-10-CM

## 2017-04-17 DIAGNOSIS — G40309 Generalized idiopathic epilepsy and epileptic syndromes, not intractable, without status epilepticus: Secondary | ICD-10-CM

## 2017-04-17 DIAGNOSIS — G40209 Localization-related (focal) (partial) symptomatic epilepsy and epileptic syndromes with complex partial seizures, not intractable, without status epilepticus: Secondary | ICD-10-CM

## 2017-04-17 DIAGNOSIS — R451 Restlessness and agitation: Secondary | ICD-10-CM

## 2017-05-23 ENCOUNTER — Other Ambulatory Visit: Payer: Self-pay | Admitting: Family

## 2017-05-23 DIAGNOSIS — G40209 Localization-related (focal) (partial) symptomatic epilepsy and epileptic syndromes with complex partial seizures, not intractable, without status epilepticus: Secondary | ICD-10-CM

## 2017-05-23 DIAGNOSIS — R4681 Obsessive-compulsive behavior: Secondary | ICD-10-CM

## 2017-05-23 DIAGNOSIS — G40309 Generalized idiopathic epilepsy and epileptic syndromes, not intractable, without status epilepticus: Secondary | ICD-10-CM

## 2017-05-23 DIAGNOSIS — R451 Restlessness and agitation: Secondary | ICD-10-CM

## 2017-06-22 ENCOUNTER — Telehealth (INDEPENDENT_AMBULATORY_CARE_PROVIDER_SITE_OTHER): Payer: Self-pay | Admitting: Family

## 2017-06-22 DIAGNOSIS — G40309 Generalized idiopathic epilepsy and epileptic syndromes, not intractable, without status epilepticus: Secondary | ICD-10-CM

## 2017-06-22 DIAGNOSIS — G40209 Localization-related (focal) (partial) symptomatic epilepsy and epileptic syndromes with complex partial seizures, not intractable, without status epilepticus: Secondary | ICD-10-CM

## 2017-06-24 MED ORDER — TRILEPTAL 300 MG PO TABS: ORAL_TABLET | ORAL | 0 refills | Status: DC

## 2017-06-24 NOTE — Telephone Encounter (Signed)
[facility] this patient has not been seen since last year August 2017. How would you like to proceed?

## 2017-06-24 NOTE — Telephone Encounter (Signed)
Rx has been printed.

## 2017-06-24 NOTE — Telephone Encounter (Signed)
I will approve one refill. I also sent a message to the scheduler's asking them to call [name]'s Mom and schedule an appointment. Thanks, Inetta Fermoina

## 2017-06-26 ENCOUNTER — Ambulatory Visit (INDEPENDENT_AMBULATORY_CARE_PROVIDER_SITE_OTHER): Payer: 59 | Admitting: Family

## 2017-06-26 ENCOUNTER — Encounter (INDEPENDENT_AMBULATORY_CARE_PROVIDER_SITE_OTHER): Payer: Self-pay | Admitting: Family

## 2017-06-26 VITALS — BP 118/70 | HR 100 | Ht 69.5 in | Wt 228.0 lb

## 2017-06-26 DIAGNOSIS — F840 Autistic disorder: Secondary | ICD-10-CM | POA: Diagnosis not present

## 2017-06-26 DIAGNOSIS — R4681 Obsessive-compulsive behavior: Secondary | ICD-10-CM

## 2017-06-26 DIAGNOSIS — G319 Degenerative disease of nervous system, unspecified: Secondary | ICD-10-CM | POA: Diagnosis not present

## 2017-06-26 DIAGNOSIS — G40309 Generalized idiopathic epilepsy and epileptic syndromes, not intractable, without status epilepticus: Secondary | ICD-10-CM

## 2017-06-26 DIAGNOSIS — R451 Restlessness and agitation: Secondary | ICD-10-CM

## 2017-06-26 DIAGNOSIS — G40209 Localization-related (focal) (partial) symptomatic epilepsy and epileptic syndromes with complex partial seizures, not intractable, without status epilepticus: Secondary | ICD-10-CM

## 2017-06-26 DIAGNOSIS — IMO0001 Reserved for inherently not codable concepts without codable children: Secondary | ICD-10-CM

## 2017-06-26 NOTE — Progress Notes (Signed)
Patient: Christian Preston MRN: [id number] Sex: male DOB: 04/18/1996  Provider: Elveria Risingina Hunt Zajicek, NP Location of Care: [facility]  Note type: Routine return visit  History of Present Illness: Referral Source: Loyola MastMelissa Lowe, MD History from: both parents and aide, patient and [facility] chart Chief Complaint: Epilepsy  Christian Preston is a 21 y.o. 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 Christian Preston was last seen March 28, 2016. He is taking and tolerating Trileptal and has remained seizure free since June 2000. His mother is concerned about him remaining on anticonvulsant medication long term and wonders if CBD oil or medical marijuana would be a safer choice for him.   Christian Preston has ongoing problems with mood, low frustration tolerance, and self-directed aggression. He can become easily agitated and sometimes needs Diazepam to help him to be calmer. Christian Preston also takes Lexapro for anxiety and his mother is also concerned about the long term effects of him taking this type of medication. Christian Preston has no language, and she is concerned that he may be suffering side effects of which he is unable to communicate. Christian Preston's father wonders if some of his agitation is related to GERD because he sometimes sees him swallowing and grimacing when he becomes agitated. When this occurs Dad gives him "Tums" and says that it sometimes helps to soothe him.   Christian Preston is cared for at home by his parents and a [employer] worker. He also attends a day program. Mom said that he seems to enjoy the day program and does better with structure and routine.   Christian Preston has been otherwise generally healthy since his last visit. He continues to gain weight and his mother is concerned about that as well. She says that she limits portions and  snacks, and tries to help him to be active. His parents have no other health concerns for him today other than previously mentioned.   Review of Systems: Please see the HPI for neurologic and other pertinent review of systems. Otherwise, all other systems were reviewed and were negative.    Past Medical History:  Diagnosis Date  . Autistic disorder, current or active state   . Seizures (HCC)    Hospitalizations: No., Head Injury: No., Nervous System Infections: No., Immunizations up to date: Yes.   Past Medical History Comments: See history Surgical History History reviewed. No pertinent surgical history.  Family History family history includes Diabetes in his paternal grandmother; Heart Problems in his paternal grandmother. Family History is otherwise negative for migraines, seizures, cognitive impairment, blindness, deafness, birth defects, chromosomal disorder, autism.  Social History Social History   Socioeconomic History  . Marital status: Single    Spouse name: None  . Number of children: None  . Years of education: None  . Highest education level: None  Social Needs  . Financial resource strain: None  . Food insecurity - worry: None  . Food insecurity - inability: None  . Transportation needs - medical: None  . Transportation needs - non-medical: None  Occupational History  . None  Tobacco Use  . Smoking status: Never Smoker  . Smokeless tobacco: Never Used  Substance and Sexual Activity  . Alcohol use: No  . Drug use: No  . Sexual activity: No  Other Topics Concern  . None  Social History Narrative   Christian Preston  attends [employer]. He enjoys movies and music. He lives with his parents.     Allergies Allergies  Allergen Reactions  . Depakote [Divalproex Sodium] Other (See Comments)    Toxicity and worsening seizures - occurred in childhood - prior to starting Trileptal in 2001  . Prozac [Fluoxetine Hcl] Other (See Comments)    Increase in obsessive  compulsive behavior  . Carbamazepine And Analogs     Other reaction(s): Unknown  . Chicken Allergy     Other reaction(s): Unknown  . Eggs Or Egg-Derived Products     Other reaction(s): Unknown    Physical Exam BP 118/70   Pulse 100   Ht 5' 9.5" (1.765 m)   Wt 228 lb (103.4 kg)   BMI 33.19 kg/m  General: well developed, well nourished slightly obese male, alternately seated and pacing in exam room, in no evident distress; sandy hair, hazel eyes, right handed Head: normocephalic and atraumatic. No dysmorphic features. I was unable to examine his pharynx Neck: supple with no carotid bruits. Cardiovascular: regular rate and rhythm, no murmurs. Respiratory: Clear to auscultation bilaterally Abdomen: Bowel sounds present all four quadrants, abdomen soft Musculoskeletal: No skeletal deformities or obvious scoliosis Skin: no rashes or neurocutaneous lesions  Neurologic Exam Mental Status: Awake and fully alert. Poor eye contact, no language, restless, alternately sitting pressing buttons on a preschool aged book or up pacing in the exam room. When sitting, occasionally rocking back and forth and making guttural sounds. No language. Resistant to invasions into his space. Calmed fairly easily to his mother's efforts to either soothe or distract him. Unable to follow simple commands. Cranial Nerves: Fundoscopic exam - red reflex present.  Unable to fully visualize fundus.  Pupils equal briskly reactive to light.  Extraocular movements appear full. Unable to assess visual fields adequately but does track objects in his visual field. Turns to locate sounds in the periphery. Appears to have symmetric facial strength with some mild drooling. Neck flexion and extension normal. Motor: Normal functional bulk, tone and strength. Could not follow instructions for adequate testing Sensory: Withdrawal x 4 Coordination: No tremor reaching for objects. Unable to adequately test otherwise due to his inability  to follow instructions Gait and Station: Arises from chair without difficulty. Stance is slightly broad based. Gait demonstrates fairly normal stride length and balance. Unable to adequately test balance because of his inability to follow instructions.  Reflexes: Unable to assess due to his inability to cooperate  Impression 1.  Complex partial seizures with secondary generalization 2.  Autism 3.  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  Cortical atrophy with increased subarachnoid spaces 5. Anxiety 6.  Episodic agitation and restlessness 7.  Obesity  Recommendations for plan of care The [facility] were reviewed. Christian Preston has neither had nor required imaging or lab studies since the last visit. 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 He is taking and tolerating Trileptal for his seizure disorder and remains seizure free. He is taking Lexapro for his anxiety, which works for the most part for his mood. He has occasional episodes of agitation that requires Diazepam to help him to be calmer. His mother had questions today about treating him with CBD oil or medical marijuana. Mom is concerned about long term effects of traditional  medications and is looking for alternative therapies. I answered her questions about his medications and about alternative therapies such as CBD oil and medical marijuana. I told her that I could not give her specific information on these treatments as they are not FDA regulated. I cautioned her to be aware that case reports and information that is posted on the internet is not regulated or scientifically validated.   I also talked with his parents about his weight and encouraged him to  continue to try to offer him healthy food choices, to limit his snacks and portions and to help him to be more active. I am concerned about him developing disorders related to obesity such as Type 2 diabetes and hypercholesterolemia.  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 His father said that he will need an FMLA form completed for work soon and I will be happy to do that. I will see Christian Macho back in follow up in 1 year or sooner if needed.   The medication list was reviewed and reconciled.  No changes were made in the prescribed medications today.  A complete medication list was provided to the patient's parents.  Allergies as of 06/26/2017      Reactions   Depakote [divalproex Sodium] Other (See Comments)   Toxicity and worsening seizures - occurred in childhood - prior to starting Trileptal in 2001   Prozac [fluoxetine Hcl] Other (See Comments)   Increase in obsessive compulsive behavior   Carbamazepine And Analogs    Other reaction(s): Unknown   Chicken Allergy    Other reaction(s): Unknown   Eggs Or Egg-derived Products    Other reaction(s): Unknown      Medication List        Accurate as of 06/26/17 11:59 PM. Always use your most recent med list.          diazepam 1 MG/ML solution Commonly known as:  VALIUM GIVE ONCE DAILY AS NEEDED FOR AGITATION   escitalopram 10 MG tablet Commonly known as:  LEXAPRO TAKE 1 TABLET BY MOUTH EVERY DAY   TRILEPTAL 300 MG tablet Generic drug:  Oxcarbazepine TAKE 1 AND 1/2 TABLETS TWICE DAILY       Total time spent with the patient was 25 minutes, of which 50% or more was spent in counseling and coordination of care.   Elveria Rising NP-C

## 2017-06-28 ENCOUNTER — Other Ambulatory Visit: Payer: Self-pay | Admitting: Family

## 2017-06-28 ENCOUNTER — Encounter (INDEPENDENT_AMBULATORY_CARE_PROVIDER_SITE_OTHER): Payer: Self-pay | Admitting: Family

## 2017-06-28 DIAGNOSIS — R4681 Obsessive-compulsive behavior: Secondary | ICD-10-CM

## 2017-06-28 DIAGNOSIS — R451 Restlessness and agitation: Secondary | ICD-10-CM

## 2017-06-28 MED ORDER — TRILEPTAL 300 MG PO TABS: ORAL_TABLET | ORAL | 5 refills | Status: DC

## 2017-06-28 MED ORDER — DIAZEPAM 1 MG/ML PO SOLN: ORAL | 5 refills | Status: DC

## 2017-06-28 NOTE — Patient Instructions (Signed)
Thank you for coming in today.   Instructions for you until your next appointment are as follows: 1. Continue Christian Preston's medications as you have been giving them.  2. Let me know if he has any seizures.  3. I am concerned about his ongoing weight gain. Try to reduce portion sizes and snacks, work on healthy food choices and try to help him to get more exercise. I have printed his last 3 weights for you to see: Wt Readings from Last 3 Encounters:  06/26/17 228 lb (103.4 kg)  03/28/16 208 lb 6.4 oz (94.5 kg)  02/02/15 213 lb (96.6 kg)  4. Please sign up for MyChart if you have not done so 5. Please plan to return for follow up in one year or sooner if needed.

## 2017-08-01 ENCOUNTER — Other Ambulatory Visit: Payer: Self-pay | Admitting: Family

## 2017-08-01 DIAGNOSIS — G40209 Localization-related (focal) (partial) symptomatic epilepsy and epileptic syndromes with complex partial seizures, not intractable, without status epilepticus: Secondary | ICD-10-CM

## 2017-08-01 DIAGNOSIS — R451 Restlessness and agitation: Secondary | ICD-10-CM

## 2017-08-01 DIAGNOSIS — G40309 Generalized idiopathic epilepsy and epileptic syndromes, not intractable, without status epilepticus: Secondary | ICD-10-CM

## 2017-08-01 DIAGNOSIS — R4681 Obsessive-compulsive behavior: Secondary | ICD-10-CM

## 2017-08-04 ENCOUNTER — Emergency Department (HOSPITAL_COMMUNITY): Payer: 59

## 2017-08-04 ENCOUNTER — Encounter (HOSPITAL_COMMUNITY): Payer: Self-pay | Admitting: Emergency Medicine

## 2017-08-04 ENCOUNTER — Emergency Department (HOSPITAL_COMMUNITY)
Admission: EM | Admit: 2017-08-04 | Discharge: 2017-08-04 | Disposition: A | Discharge status: Home / Self Care | Payer: 59 | Attending: Emergency Medicine | Admitting: Emergency Medicine

## 2017-08-04 DIAGNOSIS — R6889 Other general symptoms and signs: Secondary | ICD-10-CM | POA: Diagnosis not present

## 2017-08-04 DIAGNOSIS — F840 Autistic disorder: Secondary | ICD-10-CM | POA: Diagnosis not present

## 2017-08-04 DIAGNOSIS — R509 Fever, unspecified: Secondary | ICD-10-CM | POA: Diagnosis present

## 2017-08-04 LAB — CBC WITH DIFFERENTIAL/PLATELET
Basophils Absolute: 0 10*3/uL (ref 0.0–0.1)
Basophils Relative: 0 %
EOS ABS: 0 10*3/uL (ref 0.0–0.7)
EOS PCT: 0 %
HCT: 42.2 % (ref 39.0–52.0)
HEMOGLOBIN: 14.8 g/dL (ref 13.0–17.0)
LYMPHS ABS: 1.5 10*3/uL (ref 0.7–4.0)
Lymphocytes Relative: 18 %
MCH: 30.5 pg (ref 26.0–34.0)
MCHC: 35.1 g/dL (ref 30.0–36.0)
MCV: 86.8 fL (ref 78.0–100.0)
MONOS PCT: 8 %
Monocytes Absolute: 0.7 10*3/uL (ref 0.1–1.0)
NEUTROS PCT: 74 %
Neutro Abs: 6.3 10*3/uL (ref 1.7–7.7)
Platelets: 209 10*3/uL (ref 150–400)
RBC: 4.86 MIL/uL (ref 4.22–5.81)
RDW: 13 % (ref 11.5–15.5)
WBC: 8.5 10*3/uL (ref 4.0–10.5)

## 2017-08-04 LAB — COMPREHENSIVE METABOLIC PANEL
ALBUMIN: 4.8 g/dL (ref 3.5–5.0)
ALK PHOS: 60 U/L (ref 38–126)
ALT: 48 U/L (ref 17–63)
AST: 26 U/L (ref 15–41)
Anion gap: 10 (ref 5–15)
BUN: 10 mg/dL (ref 6–20)
CALCIUM: 9.4 mg/dL (ref 8.9–10.3)
CO2: 24 mmol/L (ref 22–32)
CREATININE: 0.7 mg/dL (ref 0.61–1.24)
Chloride: 105 mmol/L (ref 101–111)
GFR calc Af Amer: >60 mL/min (ref >60)
GFR calc non Af Amer: >60 mL/min (ref >60)
GLUCOSE: 127 mg/dL — AB (ref 65–99)
Potassium: 3.7 mmol/L (ref 3.5–5.1)
SODIUM: 139 mmol/L (ref 135–145)
Total Bilirubin: 1.1 mg/dL (ref 0.3–1.2)
Total Protein: 8.1 g/dL (ref 6.5–8.1)

## 2017-08-04 LAB — I-STAT CHEM 8, ED
BUN: 13 mg/dL (ref 6–20)
CALCIUM ION: 1.06 mmol/L — AB (ref 1.15–1.40)
CHLORIDE: 106 mmol/L (ref 101–111)
Creatinine, Ser: 0.8 mg/dL (ref 0.61–1.24)
GLUCOSE: 103 mg/dL — AB (ref 65–99)
HCT: 47 % (ref 39.0–52.0)
Hemoglobin: 16 g/dL (ref 13.0–17.0)
Potassium: 6.3 mmol/L (ref 3.5–5.1)
SODIUM: 140 mmol/L (ref 135–145)
TCO2: 26 mmol/L (ref 22–32)

## 2017-08-04 LAB — LIPASE, BLOOD: Lipase: 27 U/L (ref 11–51)

## 2017-08-04 MED ORDER — DIAZEPAM 1 MG/ML PO SOLN
3.0000 mg | Freq: Once | ORAL | Status: AC
  Administered 2017-08-04: 3 mg via ORAL

## 2017-08-04 MED ORDER — DIAZEPAM 5 MG/ML PO CONC: 5.0000 mg | Freq: Once | ORAL | Status: DC

## 2017-08-04 MED ORDER — PANTOPRAZOLE SODIUM 20 MG PO TBEC: 20.0000 mg | DELAYED_RELEASE_TABLET | Freq: Every day | ORAL | 0 refills | Status: DC

## 2017-08-04 NOTE — ED Notes (Signed)
ED Provider at bedside. 

## 2017-08-04 NOTE — ED Notes (Signed)
This RN attempted to gain IV access and draw labs x2. Will consult another nurse for ultrasound IV.

## 2017-08-04 NOTE — Discharge Instructions (Signed)
Have the patient drink plenty of fluids and follow-up with his family doctor in 2 days for recheck.

## 2017-08-04 NOTE — ED Notes (Signed)
Attempted IV, able to obtain Chem 8 I stat. Spoke with EDP who will speak with family and also allowing pt to drink fluids.

## 2017-08-04 NOTE — ED Triage Notes (Signed)
Patient is non-verbal, patient not been acting per baseline all week. Been not sleeping well, not eating per usual, crying a lot.

## 2017-08-04 NOTE — ED Provider Notes (Signed)
[facility]-EMERGENCY DEPT Provider Note   CSN: [id number] Arrival date & time: 08/04/17  1216     History   Chief Complaint Chief Complaint  Patient presents with  . not acting like normal    HPI Christian Preston is a 21 y.o. male.  Patient with long-standing autism.  Nonverbal.  Brought in by his mother.  Yesterday patient seem to be uncomfortable.  Mother was concerned that patient was getting sick.  Patient rarely gets sick.  Today had low-grade fevers.  Did not sleep well overnight.  Not eating as much as usual.  Voice seem more hoarse than usual.  Although patient is nonverbal.  Patient would hit his chest which was a sign that may be there was some discomfort there.  No nausea or vomiting no diarrhea.      Past Medical History:  Diagnosis Date  . Autistic disorder, current or active state   . Seizures [facility](HCC)     Patient Active Problem List   Diagnosis Date Noted  . Obesity 02/02/2015  . Restlessness 12/23/2013  . Obsessive behavior 12/23/2013  . 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 Partial epilepsy with impairment of consciousness (HCC) 12/11/2013  . Active autistic disorder 12/11/2013    History reviewed. No pertinent surgical history.     Home Medications    Prior to Admission medications   Medication Sig Start Date End Date Taking? Authorizing Provider  alum & mag hydroxide-simeth (MAALOX PLUS) 400-400-40 MG/5ML suspension Take 10 mLs by mouth every 6 (six) hours as needed for indigestion.   Yes [provider]  CANNABIDIOL PO Take 8 mg by mouth.   Yes [provider]  diazepam (VALIUM) 1 MG/ML solution GIVE 3MLS ONCE DAILY AS NEEDED FOR AGITATION 06/28/17  Yes Goodpasture, Inetta Fermoina, NP  escitalopram (LEXAPRO) 10 MG tablet TAKE 1 TABLET BY MOUTH EVERY DAY 08/01/17  Yes Elveria RisingGoodpasture, Tina,  NP  ibuprofen (ADVIL,MOTRIN) 200 MG tablet Take 400 mg by mouth every 6 (six) hours as needed.   Yes [provider]  TRILEPTAL 300 MG tablet TAKE 1 AND 1/2 TABLETS TWICE DAILY 08/01/17  Yes Elveria RisingGoodpasture, Tina, NP    Family History Family History  Problem Relation Age of Onset  . Heart Problems Paternal Grandmother   . Diabetes Paternal Grandmother     Social History Social History   Tobacco Use  . Smoking status: Never Smoker  . Smokeless tobacco: Never Used  Substance Use Topics  . Alcohol use: No  . Drug use: No     Allergies   Depakote [divalproex sodium]; Prozac [fluoxetine hcl]; Carbamazepine and analogs; Chicken allergy; and Eggs or egg-derived products   Review of Systems Review of Systems  Unable to perform ROS: Mental status change     Physical Exam Updated Vital Signs BP (!) 160/98 (BP Location: Left Arm)   Pulse (!) 133   Temp 99.1 F (37.3 C) (Tympanic)   Resp (!) 24   SpO2 95%   Physical Exam  Constitutional: He appears well-developed and well-nourished. No distress.  HENT:  Mouth/Throat: Oropharynx is clear and moist.  Eyes: Conjunctivae and EOM are normal. Pupils are equal, round, and reactive to light.  Neck: Neck supple.  Cardiovascular: Regular rhythm.  Slightly tachycardic  Pulmonary/Chest: Effort normal and breath sounds normal. No respiratory distress. He has no wheezes. He has no rales. He exhibits no tenderness.  Abdominal: Soft. Bowel sounds are normal. There is no tenderness.  Musculoskeletal: Normal range of motion.  Neurological: He is alert.  Moves all 4 extremities.  Seems to be baseline mental status as per mother.  Skin: Skin is warm. No rash noted.  Nursing note and vitals reviewed.    ED Treatments / Results  Labs (all labs ordered are listed, but only abnormal results are displayed) Labs Reviewed  I-STAT CHEM 8, ED - Abnormal; Notable for the following components:      Result Value   Potassium 6.3 (*)     Glucose, Bld 103 (*)    Calcium, Ion 1.06 (*)    All other components within normal limits  RAPID STREP SCREEN (NOT AT [facility])  COMPREHENSIVE METABOLIC PANEL  LIPASE, BLOOD  CBC WITH DIFFERENTIAL/PLATELET    EKG  EKG Interpretation None       Radiology No results found.  Procedures Procedures (including critical care time)  Medications Ordered in ED Medications  diazepam (VALIUM) 1 MG/ML solution 3 mg (not administered)     Initial Impression / Assessment and Plan / ED Course  I have reviewed the triage vital signs and the nursing notes.  Pertinent labs & imaging results that were available during my care of the patient were reviewed by me and considered in my medical decision making (see chart for details).    Patient autistic.  Symptoms seem to perhaps be related to upper respiratory in nature a little bit of a cough to be voice a little bit hoarse.  Patient kind of signaling that may be something going on in the chest.  Patient had strep throat in the past.  Rapid strep ordered.  Basic labs ordered.  I-STAT showed an elevated potassium but I think this may be hemolysis since BUN and creatinine are normal.  Regular labs have been ordered and chest x-ray ordered and pending.  If all negative patient probably stable for discharge home.  And close follow-up and observation by the mother.     Final Clinical Impressions(s) / ED Diagnoses   Final diagnoses:  Fever, unspecified fever cause  Autism    ED Discharge Orders    None       Vanetta MuldersZackowski, Calianna Kim, MD 08/04/17 [phone]

## 2017-08-28 ENCOUNTER — Other Ambulatory Visit: Payer: Self-pay | Admitting: Family

## 2017-08-28 DIAGNOSIS — R451 Restlessness and agitation: Secondary | ICD-10-CM

## 2017-08-28 DIAGNOSIS — R4681 Obsessive-compulsive behavior: Secondary | ICD-10-CM

## 2017-10-01 ENCOUNTER — Other Ambulatory Visit: Payer: Self-pay | Admitting: Family

## 2017-10-01 DIAGNOSIS — R451 Restlessness and agitation: Secondary | ICD-10-CM

## 2017-10-01 DIAGNOSIS — R4681 Obsessive-compulsive behavior: Secondary | ICD-10-CM

## 2017-10-29 ENCOUNTER — Other Ambulatory Visit: Payer: Self-pay | Admitting: Family

## 2017-10-29 DIAGNOSIS — R451 Restlessness and agitation: Secondary | ICD-10-CM

## 2017-10-29 DIAGNOSIS — R4681 Obsessive-compulsive behavior: Secondary | ICD-10-CM

## 2017-11-29 ENCOUNTER — Telehealth: Payer: Self-pay | Admitting: Family

## 2017-11-29 DIAGNOSIS — R4681 Obsessive-compulsive behavior: Secondary | ICD-10-CM

## 2017-11-29 DIAGNOSIS — R451 Restlessness and agitation: Secondary | ICD-10-CM

## 2017-12-02 NOTE — Telephone Encounter (Signed)
Patient needs appointment for future medication refills. I called his family and lvm asking them to call us back.

## 2017-12-04 ENCOUNTER — Other Ambulatory Visit: Payer: Self-pay | Admitting: Family

## 2017-12-04 DIAGNOSIS — R4681 Obsessive-compulsive behavior: Secondary | ICD-10-CM

## 2017-12-04 DIAGNOSIS — R451 Restlessness and agitation: Secondary | ICD-10-CM

## 2017-12-12 ENCOUNTER — Other Ambulatory Visit: Payer: Self-pay | Admitting: Family

## 2017-12-12 DIAGNOSIS — R451 Restlessness and agitation: Secondary | ICD-10-CM

## 2017-12-12 DIAGNOSIS — R4681 Obsessive-compulsive behavior: Secondary | ICD-10-CM

## 2017-12-17 ENCOUNTER — Ambulatory Visit (INDEPENDENT_AMBULATORY_CARE_PROVIDER_SITE_OTHER): Payer: 59 | Admitting: Family

## 2017-12-24 ENCOUNTER — Ambulatory Visit (INDEPENDENT_AMBULATORY_CARE_PROVIDER_SITE_OTHER): Payer: 59 | Admitting: Family

## 2017-12-30 ENCOUNTER — Other Ambulatory Visit: Payer: Self-pay | Admitting: Family

## 2017-12-30 DIAGNOSIS — R4681 Obsessive-compulsive behavior: Secondary | ICD-10-CM

## 2017-12-30 DIAGNOSIS — R451 Restlessness and agitation: Secondary | ICD-10-CM

## 2018-01-27 ENCOUNTER — Other Ambulatory Visit: Payer: Self-pay | Admitting: Family

## 2018-01-27 DIAGNOSIS — R4681 Obsessive-compulsive behavior: Secondary | ICD-10-CM

## 2018-01-27 DIAGNOSIS — R451 Restlessness and agitation: Secondary | ICD-10-CM

## 2018-02-10 ENCOUNTER — Other Ambulatory Visit: Payer: Self-pay | Admitting: Family

## 2018-02-10 DIAGNOSIS — IMO0001 Reserved for inherently not codable concepts without codable children: Secondary | ICD-10-CM

## 2018-02-10 DIAGNOSIS — R4681 Obsessive-compulsive behavior: Secondary | ICD-10-CM

## 2018-02-10 DIAGNOSIS — R451 Restlessness and agitation: Secondary | ICD-10-CM

## 2018-02-10 DIAGNOSIS — G319 Degenerative disease of nervous system, unspecified: Principal | ICD-10-CM

## 2018-02-10 DIAGNOSIS — F840 Autistic disorder: Secondary | ICD-10-CM

## 2018-02-14 ENCOUNTER — Other Ambulatory Visit: Payer: Self-pay | Admitting: Family

## 2018-02-14 DIAGNOSIS — G40309 Generalized idiopathic epilepsy and epileptic syndromes, not intractable, without status epilepticus: Secondary | ICD-10-CM

## 2018-02-14 DIAGNOSIS — G40209 Localization-related (focal) (partial) symptomatic epilepsy and epileptic syndromes with complex partial seizures, not intractable, without status epilepticus: Secondary | ICD-10-CM

## 2018-03-01 ENCOUNTER — Other Ambulatory Visit: Payer: Self-pay | Admitting: Family

## 2018-03-01 DIAGNOSIS — R451 Restlessness and agitation: Secondary | ICD-10-CM

## 2018-03-01 DIAGNOSIS — R4681 Obsessive-compulsive behavior: Secondary | ICD-10-CM

## 2018-03-04 ENCOUNTER — Other Ambulatory Visit: Payer: Self-pay | Admitting: Physician Assistant

## 2018-03-04 ENCOUNTER — Ambulatory Visit
Admission: RE | Admit: 2018-03-04 | Discharge: 2018-03-04 | Disposition: A | Discharge status: Home / Self Care | Payer: 59 | Source: Ambulatory Visit | Attending: Physician Assistant | Admitting: Physician Assistant

## 2018-03-04 DIAGNOSIS — R05 Cough: Secondary | ICD-10-CM

## 2018-03-04 DIAGNOSIS — R059 Cough, unspecified: Secondary | ICD-10-CM

## 2018-03-24 ENCOUNTER — Other Ambulatory Visit: Payer: Self-pay | Admitting: Family

## 2018-03-24 DIAGNOSIS — R451 Restlessness and agitation: Secondary | ICD-10-CM

## 2018-03-24 DIAGNOSIS — R4681 Obsessive-compulsive behavior: Secondary | ICD-10-CM

## 2018-04-27 ENCOUNTER — Other Ambulatory Visit: Payer: Self-pay | Admitting: Family

## 2018-04-27 DIAGNOSIS — R4681 Obsessive-compulsive behavior: Secondary | ICD-10-CM

## 2018-04-27 DIAGNOSIS — R451 Restlessness and agitation: Secondary | ICD-10-CM

## 2018-05-31 ENCOUNTER — Other Ambulatory Visit: Payer: Self-pay | Admitting: Family

## 2018-05-31 DIAGNOSIS — R4681 Obsessive-compulsive behavior: Secondary | ICD-10-CM

## 2018-05-31 DIAGNOSIS — R451 Restlessness and agitation: Secondary | ICD-10-CM

## 2018-07-08 ENCOUNTER — Other Ambulatory Visit: Payer: Self-pay | Admitting: Family

## 2018-07-08 ENCOUNTER — Telehealth (INDEPENDENT_AMBULATORY_CARE_PROVIDER_SITE_OTHER): Payer: Self-pay | Admitting: Family

## 2018-07-08 DIAGNOSIS — R451 Restlessness and agitation: Secondary | ICD-10-CM

## 2018-07-08 DIAGNOSIS — R4681 Obsessive-compulsive behavior: Secondary | ICD-10-CM

## 2018-07-08 DIAGNOSIS — G40309 Generalized idiopathic epilepsy and epileptic syndromes, not intractable, without status epilepticus: Secondary | ICD-10-CM

## 2018-07-08 DIAGNOSIS — G40209 Localization-related (focal) (partial) symptomatic epilepsy and epileptic syndromes with complex partial seizures, not intractable, without status epilepticus: Secondary | ICD-10-CM

## 2018-07-08 MED ORDER — ESCITALOPRAM OXALATE 10 MG PO TABS: 10.0000 mg | ORAL_TABLET | Freq: Every day | ORAL | 0 refills | Status: DC

## 2018-07-08 MED ORDER — TRILEPTAL 300 MG PO TABS
ORAL_TABLET | ORAL | 0 refills | Status: DC
Start: 2018-07-08 — End: 2018-07-30

## 2018-07-08 NOTE — Telephone Encounter (Signed)
Mom called made appt for 07/30/18 at 1045am khc

## 2018-07-08 NOTE — Telephone Encounter (Signed)
°  Who's calling (name and relationship to patient) : Marylu LundJanet (Mother)  Best contact number: [phone] Provider they see: Inetta Fermoina  Reason for call: Mom would like to speak with Inetta Fermoina regarding pt's medications. Mom stated pharmacy has not received a response from clinic regarding rx requests. Trileptal and Lexapro are the medications.      PRESCRIPTION REFILL ONLY  Name of prescription: Lexapro and Trileptal  [facility]armacy: [facility] [place]

## 2018-07-08 NOTE — Telephone Encounter (Signed)
I refilled the medication but he needs to come in for a visit. Please ask Mom to schedule an appointment. Thanks, Inetta Fermoina

## 2018-07-08 NOTE — Telephone Encounter (Signed)
Mom requesting a 90 day supply

## 2018-07-08 NOTE — Telephone Encounter (Signed)
Error

## 2018-07-10 ENCOUNTER — Telehealth (INDEPENDENT_AMBULATORY_CARE_PROVIDER_SITE_OTHER): Payer: Self-pay | Admitting: Family

## 2018-07-10 NOTE — Telephone Encounter (Signed)
°  Who's calling (name and relationship to patient) : Marylu LundJanet (Mother) Best contact number: [phone] Provider they see: Inetta Fermoina Reason for call: Mom stated pt's Trileptal needs a PA asap. Mom stated pt only has one dose of rx left for tonight.

## 2018-07-10 NOTE — Telephone Encounter (Signed)
PA has been done. Waiting for approval.  

## 2018-07-30 ENCOUNTER — Encounter (INDEPENDENT_AMBULATORY_CARE_PROVIDER_SITE_OTHER): Payer: Self-pay | Admitting: Family

## 2018-07-30 ENCOUNTER — Ambulatory Visit (INDEPENDENT_AMBULATORY_CARE_PROVIDER_SITE_OTHER): Payer: 59 | Admitting: Family

## 2018-07-30 VITALS — BP 120/90 | HR 80 | Ht 69.5 in | Wt 215.2 lb

## 2018-07-30 DIAGNOSIS — F840 Autistic disorder: Secondary | ICD-10-CM | POA: Diagnosis not present

## 2018-07-30 DIAGNOSIS — G40309 Generalized idiopathic epilepsy and epileptic syndromes, not intractable, without status epilepticus: Secondary | ICD-10-CM | POA: Diagnosis not present

## 2018-07-30 DIAGNOSIS — IMO0001 Reserved for inherently not codable concepts without codable children: Secondary | ICD-10-CM

## 2018-07-30 DIAGNOSIS — G40209 Localization-related (focal) (partial) symptomatic epilepsy and epileptic syndromes with complex partial seizures, not intractable, without status epilepticus: Secondary | ICD-10-CM

## 2018-07-30 DIAGNOSIS — G319 Degenerative disease of nervous system, unspecified: Secondary | ICD-10-CM | POA: Diagnosis not present

## 2018-07-30 DIAGNOSIS — R451 Restlessness and agitation: Secondary | ICD-10-CM

## 2018-07-30 DIAGNOSIS — R4681 Obsessive-compulsive behavior: Secondary | ICD-10-CM

## 2018-07-30 MED ORDER — TRILEPTAL 300 MG PO TABS
ORAL_TABLET | ORAL | 3 refills | Status: DC
Start: 2018-07-30 — End: 2018-07-31

## 2018-07-30 MED ORDER — ESCITALOPRAM OXALATE 10 MG PO TABS: 10.0000 mg | ORAL_TABLET | Freq: Every day | ORAL | 5 refills | Status: DC

## 2018-07-30 MED ORDER — DIAZEPAM 5 MG/5ML PO SOLN: ORAL | 5 refills | Status: DC

## 2018-07-30 NOTE — Progress Notes (Signed)
Patient: Christian Preston MRN: [id number] Sex: male DOB: Jan 14, 1996  Provider: Elveria Rising, NP Location of Care: [facility]  Note type: Routine return visit  History of Present Illness: Referral Source: Loyola Mast, MD History from: mother and aide, patient and [facility] chart Chief Complaint: Epilepsy  Christian Preston is a 22 y.o. 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 [name] was last seen June 26, 2017. He is taking and tolerating Trileptal and has remained seizure free since June 2000. He has ongoing difficulties with mood, low frustration tolerance and self-directed aggression. Lexapro has helped to reduce anxiety and boost his mood. He occasionally becomes agitated and Diazepam works to help him to be calmer.   [name] is cared for at home by his parents and a CAP worker. Mom is concerned that Medicaid is going to try to reduce his caregiver hours in the coming year. [name] attends a day program and does well with a routine and structured activities. His CAP worker works well with [name] to manage his moods and behaviors.   [name] has been otherwise generally healthy since he was last seen. He has had a problem with weight gain in the past and Mom works to limit portions and snacks, and tries to keep him active during the day.Mom has no other health concerns for [name] today other than previously mentioned.  Review of Systems: Please see the HPI for neurologic and other pertinent review of systems. Otherwise, all other systems were reviewed and were negative.    Past Medical History:  Diagnosis Date  . Autistic disorder, current or active state   . Seizures (HCC)    Hospitalizations: No., Head Injury: No., Nervous System Infections: No., Immunizations  up to date: Yes.   Past Medical History Comments: See HPI  Surgical History History reviewed. No pertinent surgical history.  Family History family history includes Diabetes in his paternal grandmother; Heart Problems in his paternal grandmother. Family History is otherwise negative for migraines, seizures, cognitive impairment, blindness, deafness, birth defects, chromosomal disorder, autism.  Social History Social History   Socioeconomic History  . Marital status: Single    Spouse name: Not on file  . Number of children: Not on file  . Years of education: Not on file  . Highest education level: Not on file  Occupational History  . Not on file  Social Needs  . Financial resource strain: Not on file  . Food insecurity:    Worry: Not on file    Inability: Not on file  . Transportation needs:    Medical: Not on file    Non-medical: Not on file  Tobacco Use  . Smoking status: Never Smoker  . Smokeless tobacco: Never Used  Substance and Sexual Activity  . Alcohol use: No  . Drug use: No  . Sexual activity: Never  Lifestyle  . Physical activity:    Days per week: Not on file    Minutes per session: Not on file  . Stress: Not on file  Relationships  . Social connections:    Talks on phone: Not on file    Gets together: Not on file    Attends religious service: Not on file    Active member of club or organization: Not on file    Attends meetings of clubs  or organizations: Not on file    Relationship status: Not on file  Other Topics Concern  . Not on file  Social History Narrative   [name] attends [employer]. He enjoys movies and music. He lives with his parents.     Allergies Allergies  Allergen Reactions  . Depakote [Divalproex Sodium] Other (See Comments)    Toxicity and worsening seizures - occurred in childhood - prior to starting Trileptal in 2001  . Prozac [Fluoxetine Hcl] Other (See Comments)    Increase in obsessive compulsive behavior  .  Carbamazepine And Analogs     Other reaction(s): Unknown  . Chicken Allergy     Other reaction(s): Unknown  . Eggs Or Egg-Derived Products     Other reaction(s): Unknown    Physical Exam BP 120/90   Pulse 80   Ht 5' 9.5" (1.765 m)   Wt 215 lb 3.2 oz (97.6 kg)   BMI 31.32 kg/m  General: Well developed, well nourished young man, seated in exam room and pacing in the exam room at times, in no evident distress, sandy hair, hazel eyes, right handed Head: Head normocephalic and atraumatic.  Oropharynx appears benign but exam was limited due to his inability to cooperate.  Neck: Supple with no carotid bruits Cardiovascular: Regular rate and rhythm, no murmurs Respiratory: Breath sounds clear to auscultation Musculoskeletal: No obvious deformities or scoliosis Skin: No rashes or neurocutaneous lesions  Neurologic Exam Mental Status: Awake and fully alert. Poor eye contact, no language, restless at times, placed with infant musical toy during most of visit. Resistant to invasions into his space. Unable to follow simple commands or participate in examination. Cranial Nerves: Fundoscopic exam reveals red reflex. Unable to fully visualize fundus. Pupils briskly reactive to light. Unable to assess extraocular movements or visual fields due to his inability to cooperate. Turns to locate some objects and sounds in the periphery. Facial movements appear symmetrical with mild drooling.  Neck flexion and extension normal. Motor: Normal functional bulk, tone and strength.  Sensory: Withdrawal x 4 Coordination: No tremor when reaching for objects. Unable to adequately assess due to his inability to follow instructions. Gait and Station: Arises from chair without difficulty.  Stance is slightly broad based.  Gait demonstrates fairly normal stride length and balance. Unable to follow instructions to test balance.  Reflexes: Unable to assess due to his inability to cooperate  Impression 1.  Complex partial  seizures with secondary generalization 2.  Autism 3.  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  Cortical atrophy with increased subarachnoid spaces 5.  Anxiety 6.  Episodic agitation and restlessness  Recommendations for plan of care [facility]. [name] has neither had nor required imaging or lab studies since the last visit. 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 He is taking and tolerating Trileptal and will continue on this medication without change. He is taking Lexapro for mood, and occasional Diazepam for agitation. [name] is doing well at this time, Mom is working to keep his weight controlled and I commended her for that. I will see [name] back in follow up in 1 year or sooner if needed. Mom agreed with the plans made today.  The medication list was reviewed and reconciled.  No changes were made in the  prescribed medications today.  A complete medication list was provided to the patient's mother.  Allergies as of 07/30/2018      Reactions   Depakote [divalproex Sodium] Other (See Comments)   Toxicity and worsening seizures - occurred in childhood - prior to starting Trileptal in 2001   Prozac [fluoxetine Hcl] Other (See Comments)   Increase in obsessive compulsive behavior   Carbamazepine And Analogs    Other reaction(s): Unknown   Chicken Allergy    Other reaction(s): Unknown   Eggs Or Egg-derived Products    Other reaction(s): Unknown      Medication List        Accurate as of 07/30/18 10:17 AM. Always use your most recent med list.          alum & mag hydroxide-simeth 400-400-40 MG/5ML suspension Commonly known as:  MAALOX PLUS Take 10 mLs by mouth every 6 (six) hours as needed  for indigestion.   CANNABIDIOL PO Take 8 mg by mouth.   diazePAM 5 MG/5ML Soln GIVE 3 MLS ONCE DAILY AS NEEDED FOR AGITATION   escitalopram 10 MG tablet Commonly known as:  LEXAPRO Take 1 tablet (10 mg total) by mouth daily.   ibuprofen 200 MG tablet Commonly known as:  ADVIL,MOTRIN Take 400 mg by mouth every 6 (six) hours as needed.   pantoprazole 20 MG tablet Commonly known as:  PROTONIX Take 1 tablet (20 mg total) by mouth daily.   TRILEPTAL 300 MG tablet Generic drug:  Oxcarbazepine TAKE 1 AND 1/2 TABLETS TWICE DAILY       Total time spent with the patient was 20 minutes, of which 50% or more was spent in counseling and coordination of care.   Elveria Rising NP-C

## 2018-07-30 NOTE — Patient Instructions (Signed)
Thank you for coming in today.   Instructions for you until your next appointment are as follows: 1. Continue giving [name] the Trileptal, Lexapro and Diazepam as you have been doing 2. Let me know if he has any seizures 3. Continue to work on limiting [name]'s portion sizes and snacks. He has lost some weight since his last visit, and I would like to see him lose a little more.  Wt Readings from Last 3 Encounters:  07/30/18 215 lb 3.2 oz (97.6 kg)  06/26/17 228 lb (103.4 kg)  03/28/16 208 lb 6.4 oz (94.5 kg)   4. Please sign up for MyChart if you have not done so 5. Please plan to return for follow up in one year or sooner if needed.

## 2018-07-31 ENCOUNTER — Other Ambulatory Visit (INDEPENDENT_AMBULATORY_CARE_PROVIDER_SITE_OTHER): Payer: Self-pay | Admitting: Family

## 2018-07-31 DIAGNOSIS — G40309 Generalized idiopathic epilepsy and epileptic syndromes, not intractable, without status epilepticus: Secondary | ICD-10-CM

## 2018-07-31 DIAGNOSIS — G40209 Localization-related (focal) (partial) symptomatic epilepsy and epileptic syndromes with complex partial seizures, not intractable, without status epilepticus: Secondary | ICD-10-CM

## 2018-07-31 MED ORDER — TRILEPTAL 300 MG PO TABS
ORAL_TABLET | ORAL | 3 refills | Status: DC
Start: 2018-07-31 — End: 2019-06-23

## 2019-06-09 ENCOUNTER — Other Ambulatory Visit (INDEPENDENT_AMBULATORY_CARE_PROVIDER_SITE_OTHER): Payer: Self-pay | Admitting: Family

## 2019-06-09 DIAGNOSIS — F840 Autistic disorder: Secondary | ICD-10-CM

## 2019-06-09 DIAGNOSIS — R4681 Obsessive-compulsive behavior: Secondary | ICD-10-CM

## 2019-06-09 DIAGNOSIS — R451 Restlessness and agitation: Secondary | ICD-10-CM

## 2019-06-23 ENCOUNTER — Telehealth (INDEPENDENT_AMBULATORY_CARE_PROVIDER_SITE_OTHER): Payer: Self-pay | Admitting: Family

## 2019-06-23 ENCOUNTER — Encounter (INDEPENDENT_AMBULATORY_CARE_PROVIDER_SITE_OTHER): Payer: Self-pay | Admitting: Family

## 2019-06-23 ENCOUNTER — Other Ambulatory Visit: Payer: Self-pay

## 2019-06-23 ENCOUNTER — Ambulatory Visit (INDEPENDENT_AMBULATORY_CARE_PROVIDER_SITE_OTHER): Payer: 59 | Admitting: Family

## 2019-06-23 DIAGNOSIS — G40309 Generalized idiopathic epilepsy and epileptic syndromes, not intractable, without status epilepticus: Secondary | ICD-10-CM | POA: Diagnosis not present

## 2019-06-23 DIAGNOSIS — F840 Autistic disorder: Secondary | ICD-10-CM | POA: Diagnosis not present

## 2019-06-23 DIAGNOSIS — G40209 Localization-related (focal) (partial) symptomatic epilepsy and epileptic syndromes with complex partial seizures, not intractable, without status epilepticus: Secondary | ICD-10-CM | POA: Diagnosis not present

## 2019-06-23 DIAGNOSIS — R451 Restlessness and agitation: Secondary | ICD-10-CM

## 2019-06-23 DIAGNOSIS — R4681 Obsessive-compulsive behavior: Secondary | ICD-10-CM

## 2019-06-23 MED ORDER — DIAZEPAM 5 MG/5ML PO SOLN: ORAL | 5 refills | Status: DC

## 2019-06-23 MED ORDER — TRILEPTAL 300 MG PO TABS
ORAL_TABLET | ORAL | 3 refills | Status: DC
Start: 2019-06-23 — End: 2019-08-04

## 2019-06-23 MED ORDER — ESCITALOPRAM OXALATE 10 MG PO TABS: 10.0000 mg | ORAL_TABLET | Freq: Every day | ORAL | 5 refills | Status: DC

## 2019-06-23 NOTE — Progress Notes (Signed)
This is a Pediatric Specialist E-Visit follow up consult provided via WebEx Otho NajjarJoshua L Puello and their parent/guardian Jason FilaJanet Wey consented to an E-Visit consult today.  Location of patient: Christian BootyJoshua is at home Location of provider: Elveria Risingina Alick Lecomte, NP is in office Patient was referred by Barbie BannerWilson, Fred H, MD   The following participants were involved in this E-Visit: mom, patient, CMA, provider  Chief Complain/ Reason for E-Visit today: seizure follow up Total time on call: 15 min Follow up: 6 months     Otho NajjarJoshua L Preston   MRN:  [id number]  07/03/1996   Provider: Elveria Risingina Elmon Shader NP-C [facility]  Visit type: Routine visit  Last visit: 07/30/2018  Referral source: Benedetto GoadFred Wilson, MD History from: patient, mom, and [facility] chart  Brief history:  Copied from previous record: History of complex partial seizures with secondary generalization, autism, 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 He is taking and tolerating Trileptal and has remained seizure free since June 2000. Christian Preston has ongoing problems mood, low frustration tolerance, and self directed aggression. Lexapro has helped with his mood and he has Diazepam for bouts of agitation.   Today's concerns: Mom reports today that Christian Preston has been doing well with being at home due to Covid 19 pandemic restrictions. He is enrolled at [employer] but is separated from other attendees there because he is intolerant of wearing a mask and doesn't understand social distancing. His caregivers and his family work well with Christian Preston to redirect him when he gets restless or frustrated. Occasionally he becomes agitated and needs Diazepam to help him to be calmer and less self-injurious.   Christian Preston has been otherwise generally healthy since he was last seen. Mom has no other health concerns for him  today other than previously mentioned.   Review of systems: Please see HPI for neurologic and other pertinent review of systems. Otherwise all other systems were reviewed and were negative.  Problem List: Patient Active Problem List   Diagnosis Date Noted  . Obesity 02/02/2015  . Restlessness 12/23/2013  . Obsessive behavior 12/23/2013  . 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 Partial epilepsy with impairment of consciousness (HCC) 12/11/2013  . Active autistic disorder 12/11/2013     Past Medical History:  Diagnosis Date  . Autistic disorder, current or active state   . Seizures (HCC)     Past medical history comments: See HPI   Surgical history: History reviewed. No pertinent surgical history.   Family history: family history includes Diabetes in his paternal grandmother; Heart Problems in his paternal grandmother.   Social history: Social History   Socioeconomic History  . Marital status: Single    Spouse name: Not on file  . Number of children: Not on file  . Years of education: Not on file  . Highest education level: Not on file  Occupational History  . Not on file  Social Needs  . Financial resource strain: Not on file  . Food insecurity    Worry: Not on file    Inability: Not on file  . Transportation needs    Medical: Not on file    Non-medical: Not on file  Tobacco Use  . Smoking status: Never Smoker  . Smokeless tobacco: Never Used  Substance and  Sexual Activity  . Alcohol use: No  . Drug use: No  . Sexual activity: Never  Lifestyle  . Physical activity    Days per week: Not on file    Minutes per session: Not on file  . Stress: Not on file  Relationships  . Social Herbalist on phone: Not on file    Gets together: Not on file    Attends religious service: Not on file    Active member of club or  organization: Not on file    Attends meetings of clubs or organizations: Not on file    Relationship status: Not on file  . Intimate partner violence    Fear of current or ex partner: Not on file    Emotionally abused: Not on file    Physically abused: Not on file    Forced sexual activity: Not on file  Other Topics Concern  . Not on file  Social History Narrative   Lurena Joiner attends [employer]. He enjoys movies and music. He lives with his parents.      Past/failed meds:   Allergies: Allergies  Allergen Reactions  . Depakote [Divalproex Sodium] Other (See Comments)    Toxicity and worsening seizures - occurred in childhood - prior to starting Trileptal in 2001  . Prozac [Fluoxetine Hcl] Other (See Comments)    Increase in obsessive compulsive behavior  . Carbamazepine And Analogs     Other reaction(s): Unknown  . Chicken Allergy     Other reaction(s): Unknown  . Eggs Or Egg-Derived Products     Other reaction(s): Unknown     Immunizations:  There is no immunization history on file for this patient.    Diagnostics/Screenings:  Physical Exam: There were no vitals taken for this visit.  General: well developed, well nourished, pacing in his home, in no evident distress; sandy hair, hazel eyes, right handed Head: normocephalic and atraumatic. No dysmorphic features. Neck: supple Skin: no rashes or neurocutaneous lesions  Neurologic Exam Mental Status: Awake and fully alert. Has no language. Does not smile responsively. Poor eye contact. Restless and pacing during the Webex. Unable to follow directions or participate in examination. Cranial Nerves: Turns to localize faces and objects in the periphery. Turns to localize sounds in the periphery. Facial movements are symmetric. Motor: Normal functional bulk, tone and strength Sensory: Withdrawal x 4 Coordination: Unable to adequately assess due to patient's inability to participate in examination. No dysmetria when  reaching for objects. Gait and Station: Able to stand and walk independently. Balance is adequate.  Impression: 1. Complex partial seizures with secondary generalization 2. Autism 3. 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 Cortical atrophy with increased subarachnoid spaces 5. Anxiety 6. Episodic agitation and restlessness  Recommendations for plan of care: The patient's previous [facility] records were reviewed. Lurena Joiner has neither had nor required imaging or lab studies since the last visit. 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 He has remained seizure free on Trileptal since June 2000 and will remain on this medication without change. He is taking Lexapro for mood and occasional Diazepam when he becomes agitated and cannot be redirected. Mom is doing well dealing with his restless behaviors. I asked her to let me know if he has any seizures, or if his behavior  becomes problematic. I will see Christian Macho back in follow up in 6 months or sooner if needed. Mom agreed with the plans made today.   The medication list was reviewed and reconciled. No changes were made in the prescribed medications today. A complete medication list was provided to the patient.  Allergies as of 06/23/2019      Reactions   Depakote [divalproex Sodium] Other (See Comments)   Toxicity and worsening seizures - occurred in childhood - prior to starting Trileptal in 2001   Prozac [fluoxetine Hcl] Other (See Comments)   Increase in obsessive compulsive behavior   Carbamazepine And Analogs    Other reaction(s): Unknown   Chicken Allergy    Other reaction(s): Unknown   Eggs Or Egg-derived Products    Other reaction(s): Unknown      Medication List       Accurate as of  June 23, 2019 11:59 PM. If you have any questions, ask your nurse or doctor.        alum & mag hydroxide-simeth 400-400-40 MG/5ML suspension Commonly known as: MAALOX PLUS Take 10 mLs by mouth every 6 (six) hours as needed for indigestion.   CANNABIDIOL PO Take 8 mg by mouth.   diazePAM 5 MG/5ML Soln GIVE 3 MLS ONCE DAILY AS NEEDED FOR AGITATION   escitalopram 10 MG tablet Commonly known as: LEXAPRO Take 1 tablet (10 mg total) by mouth daily.   ibuprofen 200 MG tablet Commonly known as: ADVIL Take 400 mg by mouth every 6 (six) hours as needed.   pantoprazole 20 MG tablet Commonly known as: PROTONIX Take 1 tablet (20 mg total) by mouth daily.   Trileptal 300 MG tablet Generic drug: Oxcarbazepine TAKE 1 AND 1/2 TABLETS TWICE DAILY       Total time spent on the Webex with the patient and his mother was 15 minutes, of which 50% or more was spent in counseling and coordination of care.  Elveria Rising NP-C [facility] Ph. [phone] Fax [phone]

## 2019-06-23 NOTE — Patient Instructions (Signed)
Thank you for meeting with me by Webex today.   Instructions for you until your next appointment are as follows: 1. Continue Christian Preston's medications as you have been giving them. I sent in refills to [facility] today.  2. Let me know if Christian Preston has any seizures.  3. Please sign up for MyChart if you have not done so 4. Please plan to return for follow up in 6 months or sooner if needed.

## 2019-06-23 NOTE — Telephone Encounter (Signed)
°  Who's calling (name and relationship to patient) : Marcie Bal (Mother)  Best contact number: [phone] Provider they see: Otila Kluver Reason for call: Mom wanted to know if Otila Kluver could write a summary listing/detailing pt's challenges throughout the pandemic that she can present to the teachers during his IEP meeting tomorrow. Mom stated that Otila Kluver could send her the letter to via email at the address provided below.    [email or web address]

## 2019-06-23 NOTE — Telephone Encounter (Signed)
I wrote the letter and emailed it to [facility] as requested. TG

## 2019-06-25 ENCOUNTER — Encounter (INDEPENDENT_AMBULATORY_CARE_PROVIDER_SITE_OTHER): Payer: Self-pay | Admitting: Family

## 2019-08-04 ENCOUNTER — Other Ambulatory Visit (INDEPENDENT_AMBULATORY_CARE_PROVIDER_SITE_OTHER): Payer: Self-pay | Admitting: Family

## 2019-08-04 DIAGNOSIS — G40209 Localization-related (focal) (partial) symptomatic epilepsy and epileptic syndromes with complex partial seizures, not intractable, without status epilepticus: Secondary | ICD-10-CM

## 2019-08-04 DIAGNOSIS — G40309 Generalized idiopathic epilepsy and epileptic syndromes, not intractable, without status epilepticus: Secondary | ICD-10-CM

## 2019-08-04 NOTE — Telephone Encounter (Signed)
Please send to the pharmacy °

## 2019-08-12 ENCOUNTER — Other Ambulatory Visit (INDEPENDENT_AMBULATORY_CARE_PROVIDER_SITE_OTHER): Payer: Self-pay | Admitting: Family

## 2019-08-12 DIAGNOSIS — G40309 Generalized idiopathic epilepsy and epileptic syndromes, not intractable, without status epilepticus: Secondary | ICD-10-CM

## 2019-08-12 DIAGNOSIS — G40209 Localization-related (focal) (partial) symptomatic epilepsy and epileptic syndromes with complex partial seizures, not intractable, without status epilepticus: Secondary | ICD-10-CM

## 2019-08-12 NOTE — Telephone Encounter (Signed)
Please send to the pharmacy °

## 2019-09-18 ENCOUNTER — Telehealth (INDEPENDENT_AMBULATORY_CARE_PROVIDER_SITE_OTHER): Payer: Self-pay | Admitting: Family

## 2019-09-18 NOTE — Telephone Encounter (Signed)
Thank you for giving Mom that information. TG

## 2019-09-18 NOTE — Telephone Encounter (Signed)
Who's calling (name and relationship to patient) : Jarman Litton mom  Best contact number: [phone]  Provider they see: Elveria Rising  Reason for call: Mom called wanting information on how to get Jatavis vaccinated as soon as possible. Please call back with any information that can be provided.    Call ID:      PRESCRIPTION REFILL ONLY  Name of prescription:  Pharmacy:

## 2019-09-18 NOTE — Telephone Encounter (Signed)
Spoke with mom about her phone message. I informed her that she can add the patient on the wait list through the [facility] website. If there is more information, please call mom back

## 2019-11-18 ENCOUNTER — Telehealth (INDEPENDENT_AMBULATORY_CARE_PROVIDER_SITE_OTHER): Payer: Self-pay | Admitting: Family

## 2019-11-18 NOTE — Telephone Encounter (Signed)
I called and talked to Mom. She is very nervous about [name] getting the vaccine because she fears that he will have fever as a side effect, then have a seizure. I told Mom that there is no way to predict that but that current recommendations are for everyone to be vaccinated, and that she can give him Tylenol after the vaccine to help reduce incidence of fever. Mom remained fearful but agreed.

## 2019-11-18 NOTE — Telephone Encounter (Signed)
Who's calling (name and relationship to patient) : [name] mom   Best contact number: [phone]  Provider they see: Elveria Rising  Reason for call: Mom wanted to double check with Inetta Fermo how the COVID vaccine may effect Ivin Booty. Please call mom with any information. They have an appointment to receive the vaccine tomorrow.   Call ID:      PRESCRIPTION REFILL ONLY  Name of prescription:  Pharmacy:

## 2019-11-19 IMAGING — CR DG CHEST 2V
2 series · 2 of 2 positions shown · non-contrast
Comparison: 08/04/2017.

CLINICAL DATA: 21-year-old with autism and epilepsy, presenting
with 2 week history of cough.

EXAM:
CHEST - 2 VIEW

[w chest lat]
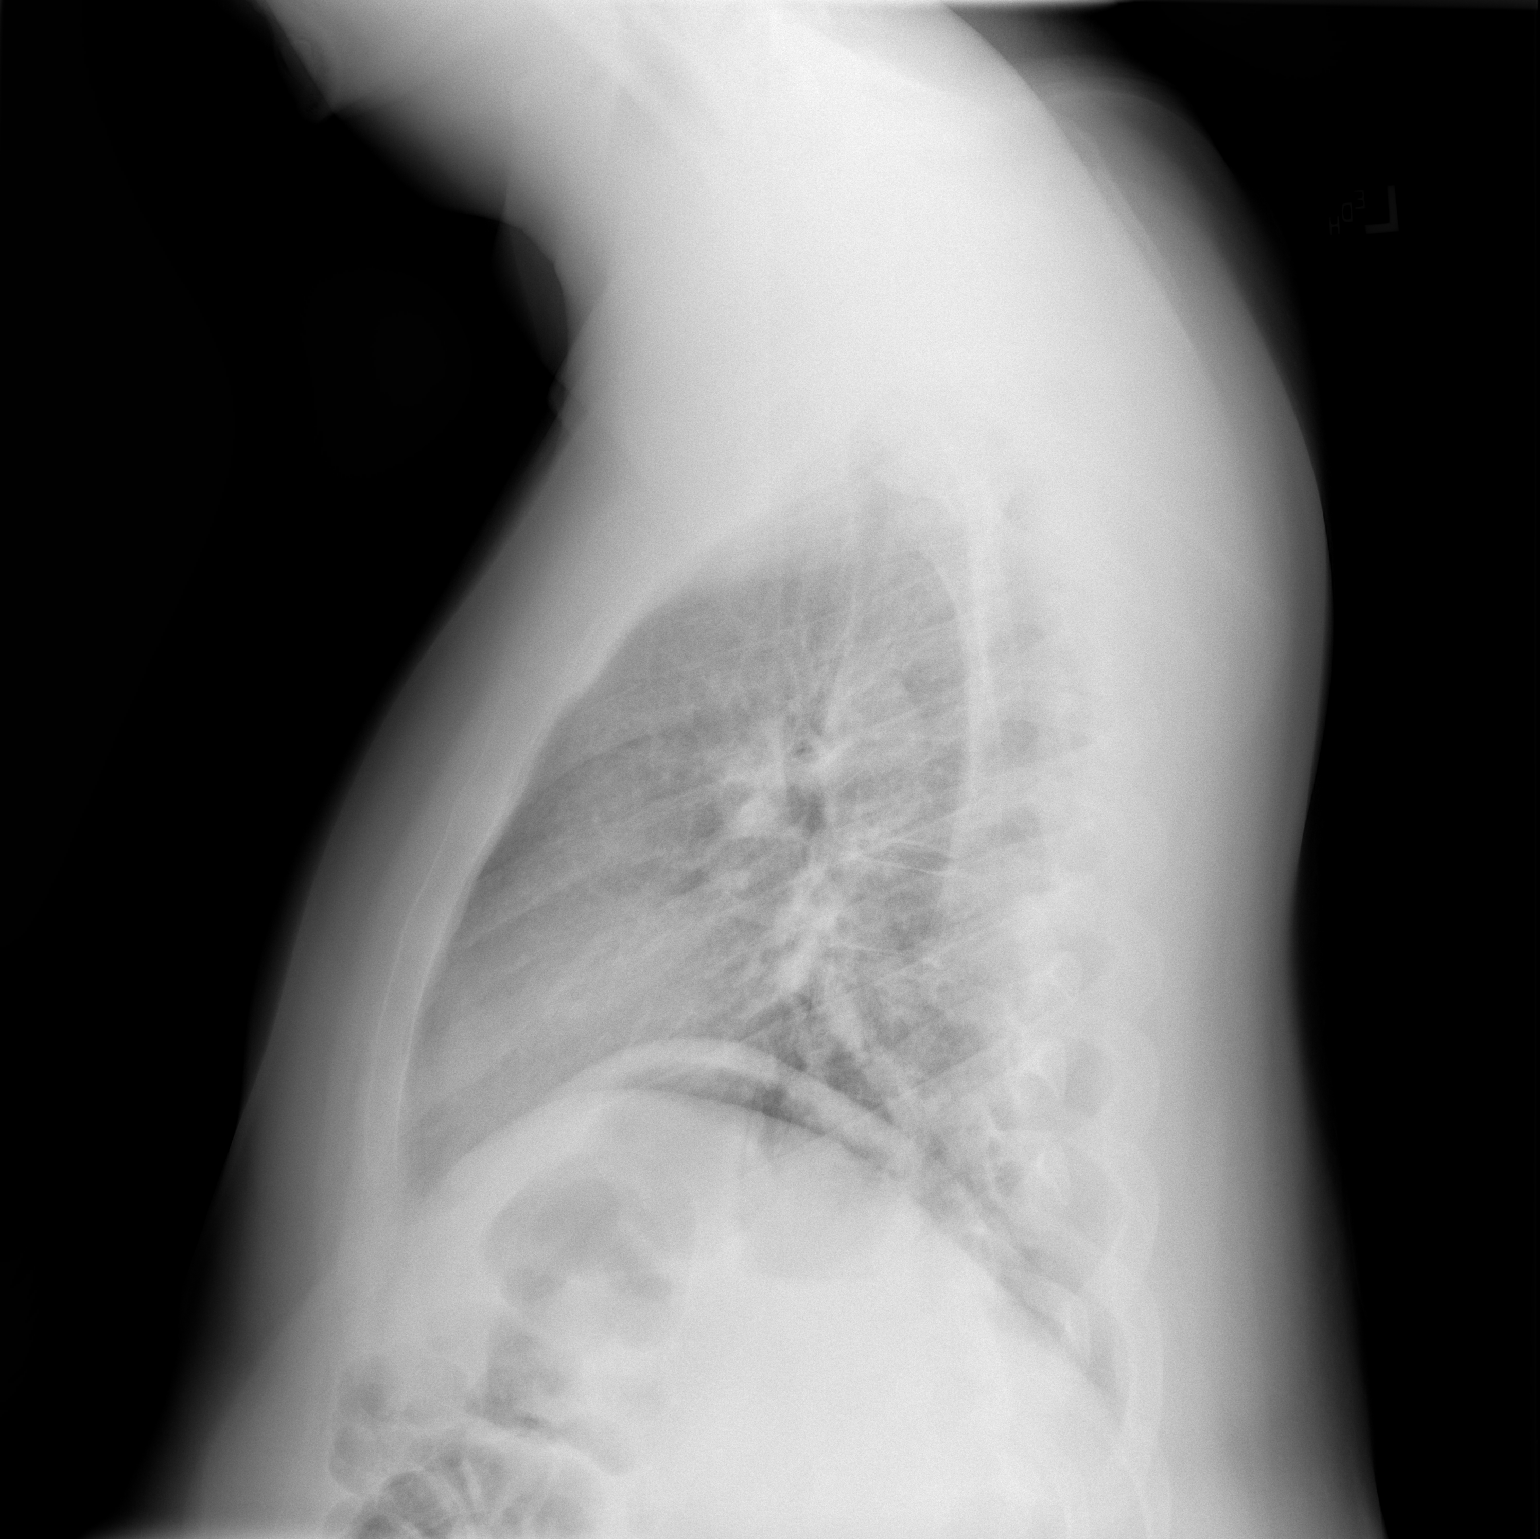

[w chest ap]
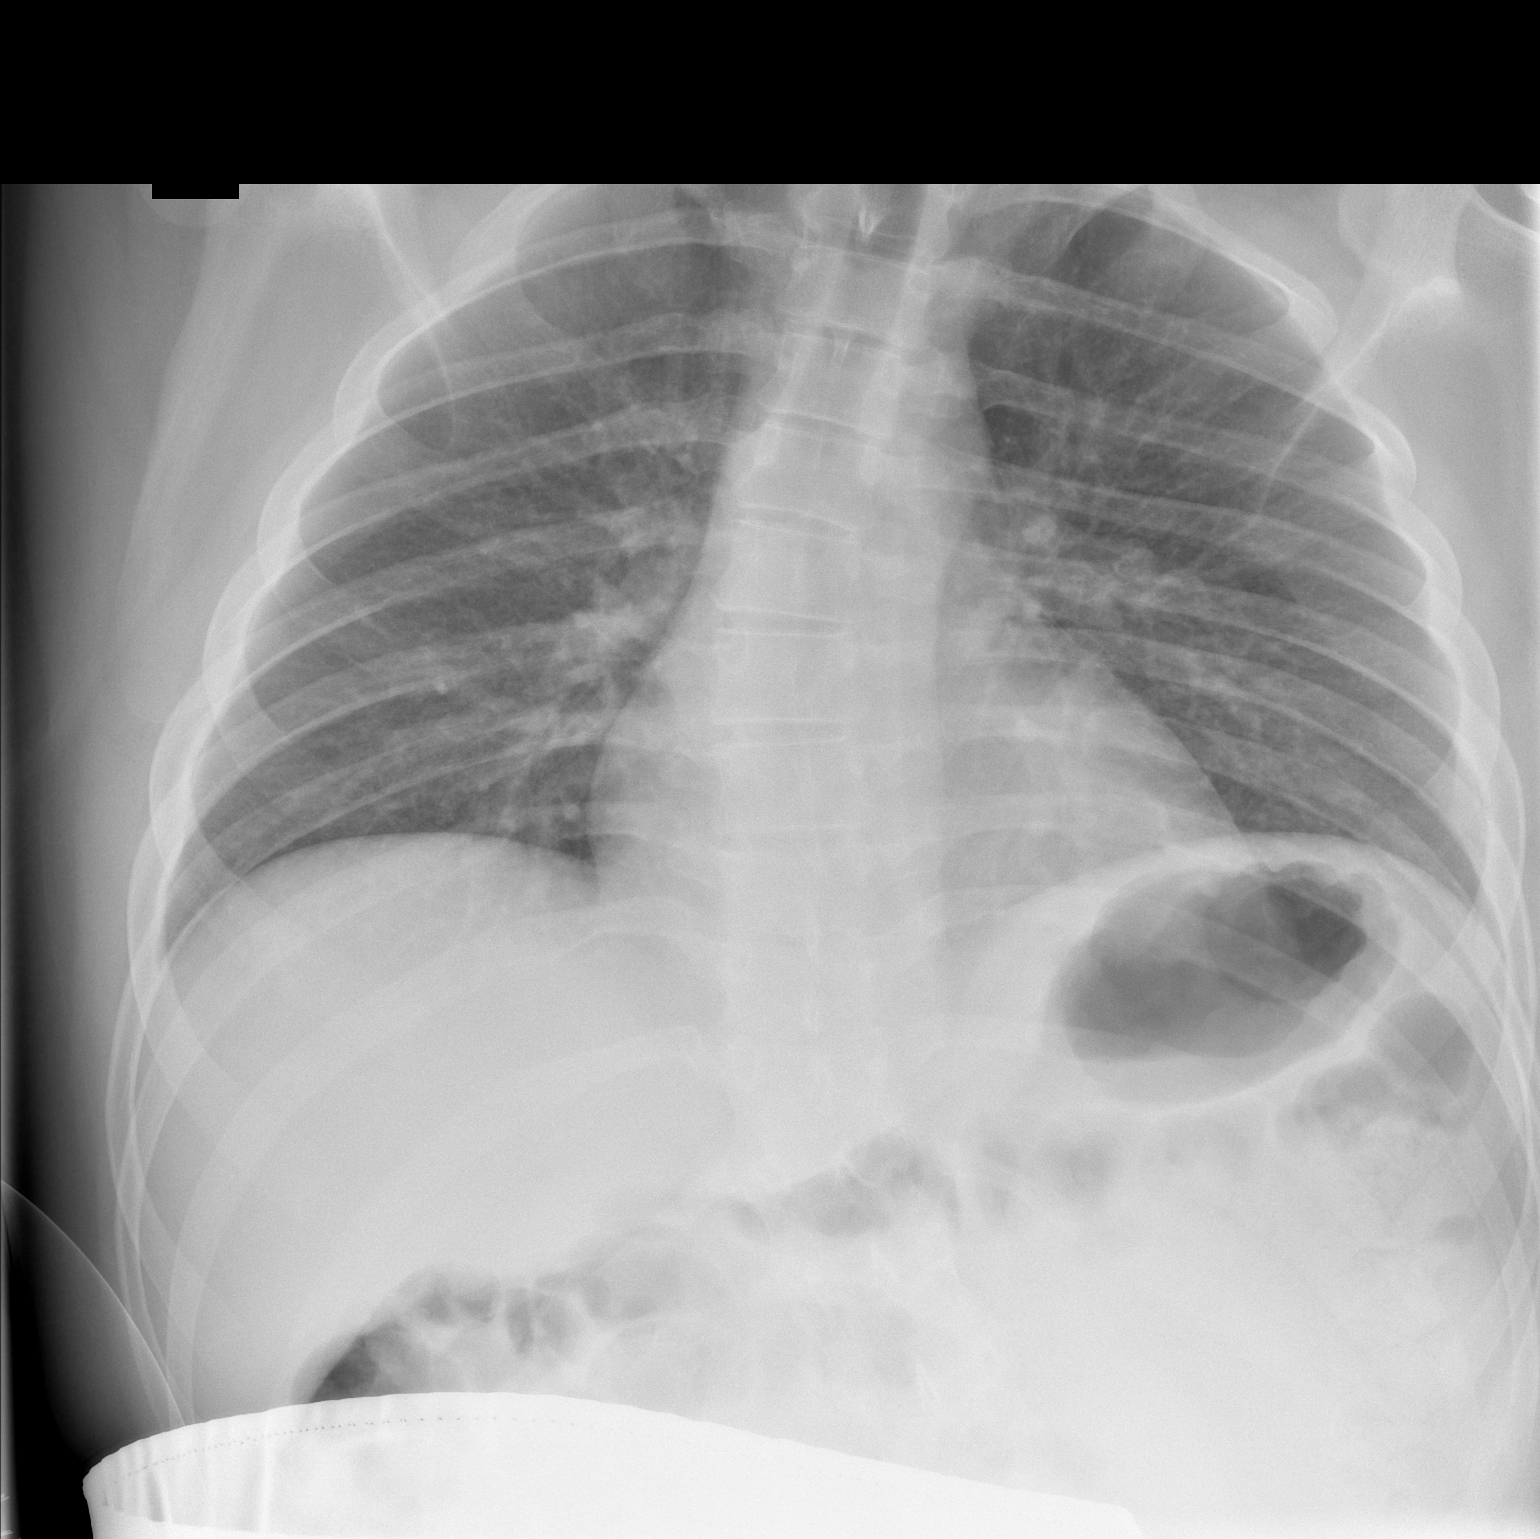

[2 of 2 positions shown; findings below may reference images not displayed]

FINDINGS: AP ERECT and LATERAL images were obtained. Cardiac silhouette normal
in size for technique. Hilar and mediastinal contours unremarkable.
Suboptimal inspiration accounts for crowded bronchovascular markings
at the bases. Taking this into account, lungs clear. Bronchovascular
markings normal. No pleural effusions. Thoracolumbar dextroscoliosis
is noted previously.
IMPRESSION: Suboptimal inspiration.  No acute cardiopulmonary disease.

## 2019-12-09 ENCOUNTER — Telehealth (INDEPENDENT_AMBULATORY_CARE_PROVIDER_SITE_OTHER): Payer: Self-pay | Admitting: Family

## 2019-12-09 NOTE — Telephone Encounter (Signed)
Forms have been placed on Tina's desk 

## 2019-12-09 NOTE — Telephone Encounter (Signed)
Mom stopped by to drop off the FMLA documents for her husbands work. Placed in Tina's box and scanned two way consent in the chart for permission to fax to [facility] when complete. Please advise mom when this is done.

## 2019-12-10 NOTE — Telephone Encounter (Signed)
The FMLA form was faxed as requested. TG

## 2019-12-14 ENCOUNTER — Other Ambulatory Visit: Payer: Self-pay

## 2019-12-14 ENCOUNTER — Encounter: Payer: Self-pay | Admitting: Physical Therapy

## 2019-12-14 ENCOUNTER — Ambulatory Visit: Payer: 59 | Attending: Physician Assistant | Admitting: Physical Therapy

## 2019-12-14 DIAGNOSIS — R262 Difficulty in walking, not elsewhere classified: Secondary | ICD-10-CM | POA: Diagnosis present

## 2019-12-14 DIAGNOSIS — R2689 Other abnormalities of gait and mobility: Secondary | ICD-10-CM | POA: Diagnosis present

## 2019-12-14 NOTE — Therapy (Signed)
[facility] [place], [place], [address] Phone: [phone]   Fax:  [phone]  Physical Therapy Evaluation  Patient Details  Name: Christian Preston MRN: [id number] Date of Birth: [birth year] Referring Provider (PT): Clyde Lundborg, [place]   Encounter Date: 12/14/2019  PT End of Session - 12/14/19 1520    Visit Number  1    Number of Visits  1    Date for PT Re-Evaluation  12/14/19    Authorization Type  Medicaid    PT Start Time  1030    PT Stop Time  1110    PT Time Calculation (min)  40 min    Equipment Utilized During Treatment  Gait belt    Activity Tolerance  Patient tolerated treatment well    Behavior During Therapy  Impulsive;Restless       Past Medical History:  Diagnosis Date  . Autistic disorder, current or active state   . Seizures ([place])     History reviewed. No pertinent surgical history.  There were no vitals filed for this visit.   Subjective Assessment - 12/14/19 1507    Subjective  COVID-19 screening performed upon arrival.Patient arrives with mother with concerns for safety around the home. Patient has not had any falls, but family is fearful of him falling from the front and back porches as they are not railed and has about a 3-foot drop to the ground. 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 Patient has a fear of negotiating steps without assistance due to a lack of railings to enter home from both the front door and the back door. Patient's mother also reported deficits in depth perception that causes increased difficulty with performing tasks such as steps. Patient is impulsive in nature and family is fearful his impulsivity will cause a fall and a possible injury. Patient requires increased supervision for safety for stepping into the shower as well.    Pertinent History  Autism, non-verbal, congential valgus  deformity of bilateral feet, depth perception deficits    Limitations  Walking    Currently in Pain?  No/denies         [facility] PT Assessment - 12/14/19 0001      Assessment   Medical Diagnosis  congenital valgus deformity of feet, cerebral atrophy    Referring Provider (PT)  Clyde Lundborg, PA-C    Onset Date/Surgical Date  --   congential     Precautions   Precautions  Fall;Other (comment)   impulsive     Restrictions   Weight Bearing Restrictions  No      Balance Screen   Has the patient fallen in the past 6 months  No    Has the patient had a decrease in activity level because of a fear of falling?   No    Is the patient reluctant to leave their home because of a fear of falling?   Yes      [place] residence    Living Arrangements  Parent;Other relatives    Type of [place] to enter    Entrance Stairs-Number of Steps  1    Entrance Stairs-Rails  None    Home Layout  One level      Prior Function   Level of Independence  Other (comment);Independent with household mobility without device  Dependent & supervision req. due to cognitive deficits     ROM / Strength   AROM / PROM / Strength  AROM      AROM   Overall AROM Comments  grossly assessed due to cognitive deficits      Transfers   Comments  able to perform with use of hands to assist to standing      Ambulation/Gait   Gait Pattern  Step-to pattern;Step-through pattern;Abducted- right;Wide base of support;Poor foot clearance - right;Poor foot clearance - left;Ataxic    Stairs  Yes    Stairs Assistance  4: Min assist   min A x2   Stair Management Technique  Sideways;Step to pattern   hand held Assist x2   Number of Stairs  2    Height of Stairs  6.5    Door Management  4: Min assist    Door Managment Details (indicate cue type and reason)  hesistant through thresholds secondary to depth perception, cuing and hand held assist    Gait  Comments  poor bilateral LE coordination, requires high stepping to clear step or threshold, frequently feels the ground with his foot before stepping                Objective measurements completed on examination: See above findings.                           Plan - 12/14/19 1516    Clinical Impression Statement  Patient is a 24 year old non verbal male who presents to physical therapy with his sister and mother who is his primary caregiver. Patient unable to follow commands due to cognitive deficits and requires assistance from sister, PT and mother to complete tasks. Patient ambulates wearing bilateral SMOs with an ataxic gait and varying gait speeds requiring supervision and manual assistance to prevent walking into objects and walls. Patient has poor depth perception as noted by the need to step and feel the floor with his foot before progressing forward over a threshold in the doorway or up/down the steps. Patient requires min A x2 with verbal coaching and manual tactile cues to negotiate the steps with and step to step gait pattern. Patient on multiple occasions attempted to walk away during evaluation due to impulsivity. Due to the observations and deficits listed above, patient would greatly benefit from home modifications including but not limited to: adding guard railings to perimeter of the porches to prevent falls off the porches, bilateral hand railings at all entrances of the home for safety to access home, railings in the shower, and a gate surrounding the property to prevent impulsive acts of running towards the main road.    Personal Factors and Comorbidities  Comorbidity 2    Examination-Activity Limitations  Stairs;Stand;Transfers;Locomotion Level    Stability/Clinical Decision Making  Evolving/Moderate complexity    Clinical Decision Making  Moderate    PT Frequency  One time visit    PT Next Visit Plan  EVAL ONLY    Consulted and Agree with Plan of  Care  Patient       Patient will benefit from skilled therapeutic intervention in order to improve the following deficits and impairments:     Visit Diagnosis: Difficulty in walking, not elsewhere classified  Other abnormalities of gait and mobility     Problem List Patient Active Problem List   Diagnosis Date Noted  . Obesity 02/02/2015  . Restlessness 12/23/2013  . Obsessive behavior  12/23/2013  . 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 Partial epilepsy with impairment of consciousness (HCC) 12/11/2013  . Active autistic disorder 12/11/2013    Guss Bunde, PT, DPT 12/14/2019, 3:22 PM  [facility] [address] [place], [place], [address] Phone: [phone]   Fax:  [phone]  Name: Christian Preston MRN: [id number] Date of Birth: 08-24-1995

## 2019-12-28 ENCOUNTER — Other Ambulatory Visit (INDEPENDENT_AMBULATORY_CARE_PROVIDER_SITE_OTHER): Payer: Self-pay | Admitting: Family

## 2019-12-28 DIAGNOSIS — R4681 Obsessive-compulsive behavior: Secondary | ICD-10-CM

## 2019-12-28 DIAGNOSIS — F840 Autistic disorder: Secondary | ICD-10-CM

## 2019-12-28 DIAGNOSIS — R451 Restlessness and agitation: Secondary | ICD-10-CM

## 2019-12-28 NOTE — Telephone Encounter (Signed)
Please send to the pharmacy °

## 2020-01-22 ENCOUNTER — Telehealth (INDEPENDENT_AMBULATORY_CARE_PROVIDER_SITE_OTHER): Payer: Self-pay | Admitting: Family

## 2020-01-22 NOTE — Telephone Encounter (Signed)
  Who's calling (name and relationship to patient) :  Mom Marylu Lund  Best contact number: [phone]  Provider they see: Seward Carol  Reason for call:      PRESCRIPTION REFILL ONLY  Name of prescription:  Pharmacy:

## 2020-01-22 NOTE — Telephone Encounter (Signed)
  Who's calling (name and relationship to patient) : Marylu Lund (MOM)  Best contact [phone]  Provider they see: Elveria Rising  Reason for call: Mom called because she has sent over paperwork she needs completed so she can get her home safe for her son [name]. She has a letter stating lukes physical limitations but also needs a letter from [place] stating Leane Call cognitive limitations. She did ask if you would mind filling this out and faxing it back to [facility].   Paperwork is in your box  PRESCRIPTION REFILL ONLY  Name of prescription:  Pharmacy:

## 2020-01-26 ENCOUNTER — Telehealth (INDEPENDENT_AMBULATORY_CARE_PROVIDER_SITE_OTHER): Payer: Self-pay | Admitting: Family

## 2020-01-26 NOTE — Telephone Encounter (Signed)
Who's calling (name and relationship to patient) : [employer]   Best contact number: [phone]  Provider they see: Elveria Rising  Reason for call: Request for a letter of support for patient's home modifications was faxed to office. If this was never received please call and have it faxed again.   Call ID:      PRESCRIPTION REFILL ONLY  Name of prescription:  Pharmacy:

## 2020-01-27 NOTE — Telephone Encounter (Signed)
L/M requesting the paperwork that [employer] is speaking of

## 2020-02-01 NOTE — Telephone Encounter (Signed)
Call back to [facility]. Advised Christian Preston is out of the office until [place]. She requests RN try to obtain letter from Dr. Sharene Skeans if possible. RN confirmed patient does not have to have been seen within a certain time frame.  Letter completed- Dr. Sharene Skeans signed and RN faxed back to [phone] Attention Dawn Alfredo Bach

## 2020-02-04 NOTE — Telephone Encounter (Signed)
This was completed by Dr Sharene Skeans while I was out of the office unexpectedly. TG

## 2020-07-24 ENCOUNTER — Other Ambulatory Visit (INDEPENDENT_AMBULATORY_CARE_PROVIDER_SITE_OTHER): Payer: Self-pay | Admitting: Family

## 2020-07-24 DIAGNOSIS — G40309 Generalized idiopathic epilepsy and epileptic syndromes, not intractable, without status epilepticus: Secondary | ICD-10-CM

## 2020-07-24 DIAGNOSIS — G40209 Localization-related (focal) (partial) symptomatic epilepsy and epileptic syndromes with complex partial seizures, not intractable, without status epilepticus: Secondary | ICD-10-CM

## 2020-07-25 NOTE — Telephone Encounter (Signed)
Patient has not been seen in a year. Send rx if applicable

## 2020-07-27 ENCOUNTER — Telehealth (INDEPENDENT_AMBULATORY_CARE_PROVIDER_SITE_OTHER): Payer: 59 | Admitting: Family

## 2020-07-27 ENCOUNTER — Encounter (INDEPENDENT_AMBULATORY_CARE_PROVIDER_SITE_OTHER): Payer: Self-pay | Admitting: Family

## 2020-07-27 VITALS — Ht 69.5 in | Wt 215.0 lb

## 2020-07-27 DIAGNOSIS — R4681 Obsessive-compulsive behavior: Secondary | ICD-10-CM

## 2020-07-27 DIAGNOSIS — R451 Restlessness and agitation: Secondary | ICD-10-CM | POA: Diagnosis not present

## 2020-07-27 DIAGNOSIS — Q999 Chromosomal abnormality, unspecified: Secondary | ICD-10-CM

## 2020-07-27 DIAGNOSIS — G40209 Localization-related (focal) (partial) symptomatic epilepsy and epileptic syndromes with complex partial seizures, not intractable, without status epilepticus: Secondary | ICD-10-CM

## 2020-07-27 DIAGNOSIS — F840 Autistic disorder: Secondary | ICD-10-CM | POA: Diagnosis not present

## 2020-07-27 DIAGNOSIS — G40309 Generalized idiopathic epilepsy and epileptic syndromes, not intractable, without status epilepticus: Secondary | ICD-10-CM

## 2020-07-27 NOTE — Progress Notes (Signed)
This is a Pediatric Specialist E-Visit follow up consult provided via MyChart video.  Christian Preston Swanger and their parent/guardian consented to an E-Visit consult today.  Location of patient: [name] is at Home(location) Location of provider: Elveria Risingina Trinetta Alemu, NP is at Office (location) Patient was referred by Barbie BannerWilson, Fred H, MD   The following participants were involved in this E-Visit: Lorre MunroeFabiola Cardenas, CMA              Elveria Risingina Osker Ayoub, NP Total time on call: 25 min Follow up: 1 year  Patient: Christian Preston MRN: [id number] Sex: male DOB: 03/19/1996   Provider: Elveria Risingina Everet Flagg NP-C [facility]  Visit type: Routine Follow-Up  Last visit: 06/23/2019  Referral source: Benedetto GoadFred Wilson, MD History from: mother, [facility] chart  Brief history:  Copied from previous record: History of complex partial seizures with secondary generalization, autism, 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 He is taking and tolerating Trileptal and has remained seizure free since June 2000. Christian Preston has ongoing problems mood, low frustration tolerance, and self directed aggression. Lexapro has helped with his mood and he has Diazepam for bouts of agitation  Today's concerns: Mom reports today that Christian Preston has been doing well since his last visit. He has workers that come in to provide care and activities for him. He continues to have bouts of being restless but redirecting him works well most of the time. He also can be aggressive about food and will take food from other people. Redirection works well for this as well. Mom said that Christian Preston was approved for some home modifications, particularly a shower and for rails on his porch so that he can safely go outside. Christian Preston has a tendency to wander, and the rails being provided will help to keep him without safe  boundaries.   Mom has questions today about whether or not he should get the Covid booster and the flu vaccine.   Christian Preston has been otherwise generally healthy since he was last seen.  Review of systems: Please see HPI for neurologic and other pertinent review of systems. Otherwise all other systems were reviewed and were negative.  Problem List: Patient Active Problem List   Diagnosis Date Noted  . Obesity 02/02/2015  . Restlessness 12/23/2013  . Obsessive behavior 12/23/2013  . 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 Partial epilepsy with impairment of consciousness (HCC) 12/11/2013  . Active autistic disorder 12/11/2013     Past Medical History:  Diagnosis Date  . Autistic disorder, current or active state   . Seizures (HCC)     Past medical history comments: See HPI Generalized tonic-clonic seizure May 2001 which lasted for 90 minutes  Surgical history: No past surgical history on file.   Family history: family history includes Diabetes in his paternal grandmother; Heart Problems in his paternal grandmother.   Social history: Social History   Socioeconomic History  . Marital status: Single    Spouse name: Not on file  . Number of children: Not on file  . Years of education: Not on file  . Highest education level: Not on file  Occupational History  . Not on file  Tobacco Use  . Smoking status: Never Smoker  . Smokeless tobacco: Never Used  Vaping Use  .  Vaping Use: Never used  Substance and Sexual Activity  . Alcohol use: No  . Drug use: No  . Sexual activity: Never  Other Topics Concern  . Not on file  Social History Narrative   Christian Macho attends [employer]. He enjoys movies and music. He lives with his parents.    Social Determinants of Health   Financial Resource Strain:   . Difficulty of Paying Living Expenses: Not  on file  Food Insecurity:   . Worried About Programme researcher, broadcasting/film/video in the Last Year: Not on file  . Ran Out of Food in the Last Year: Not on file  Transportation Needs:   . Lack of Transportation (Medical): Not on file  . Lack of Transportation (Non-Medical): Not on file  Physical Activity:   . Days of Exercise per Week: Not on file  . Minutes of Exercise per Session: Not on file  Stress:   . Feeling of Stress : Not on file  Social Connections:   . Frequency of Communication with Friends and Family: Not on file  . Frequency of Social Gatherings with Friends and Family: Not on file  . Attends Religious Services: Not on file  . Active Member of Clubs or Organizations: Not on file  . Attends Banker Meetings: Not on file  . Marital Status: Not on file  Intimate Partner Violence:   . Fear of Current or Ex-Partner: Not on file  . Emotionally Abused: Not on file  . Physically Abused: Not on file  . Sexually Abused: Not on file    Past/failed meds: Depakote - worsened seizures Prozac - increased obsessive compulsive behaviors Carbamazepine - intolerant  Allergies: Allergies  Allergen Reactions  . Depakote [Divalproex Sodium] Other (See Comments)    Toxicity and worsening seizures - occurred in childhood - prior to starting Trileptal in 2001  . Prozac [Fluoxetine Hcl] Other (See Comments)    Increase in obsessive compulsive behavior  . Carbamazepine And Analogs     Other reaction(s): Unknown  . Chicken Allergy     Other reaction(s): Unknown  . Eggs Or Egg-Derived Products     Other reaction(s): Unknown    Immunizations:  There is no immunization history on file for this patient.    Diagnostics/Screenings: Copied from previous record: MRI Brain wo contrast- 09-10-2001 [facility] - Subtle confluent area of abnormal increased T2 signal in the left periventricular white matter adjacent to the atrium and occipital horn of the left lateral ventricle. Fairly normal  myelination pattern seen in the motor strips bilaterally. The remainder of the myelination pattern, the frontal and parietal lobes is markedly delayed for patient's age. The myelination pattern correlates to a  patient of less than 1 year of age. Small right maxillary sinus mucous retention cyst. Mild bilateral cerebral atrophy more than anticipated for patient's age. No distinct intracranial lesions. The medial temporal lobes and hippocampi are otherwise normal. The CSF pathways and ventricles are normal. The remainder of the brain parenchyma is normal. The calvarium is normal. The orbital structures are normal. The intracranial flow voids are normal. No areas of restricted diffusion on the DWI sequence to suggest an acute infarct.  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    Physical Exam: Ht 5' 9.5" (1.765 m) Comment: reported  Wt 215 lb (97.5 kg) Comment: reported  BMI 31.29 kg/m   General: well developed, well nourished man, seated at home with his mother,  in no evident distress; sandy hair, hazel eyes, right handed Head: normocephalic and atraumatic. No dysmorphic features. Neck: supple Musculoskeletal: no skeletal deformities or obvious scoliosis.  Skin: no rashes or neurocutaneous lesions  Neurologic Exam Mental Status: awake and fully alert. Has no language.  Does not smile responsively. No eye contact or attention to the video. Played with bead necklace throughout the video.  Cranial Nerves: Turns to localize faces and objects in the periphery. Turns to localize sounds in the periphery. Facial movements are symmetric. Motor: normal functional bulk, tone and strength Sensory: withdrawal x 4 Coordination: unable to adequately assess due to patient's inability to participate in examination. No dysmetria when reaching for objects. Gait and Station: able to stand and walk independently  Impression: 1.  Complex partial seizures with secondary generalization 2. Autism 3. 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 Cortical atrophy 5. Anxiety 6. Episodic agitation and restlessness  Recommendations for plan of care: [facility] were [facility]. Christian Macho has neither had nor required imaging or lab studies since the last visit. 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 He has remained seizure free on Trileptal since June 2000 and will remain on this medication without change. He takes Lexapro for mood and occasional Diazepam for agitation when needed. Mom had questions and concerns today about whether or not Christian Macho should have the Covid-19 booster and the flu vaccine, and I told Mom that I recommended that [facility] receive both. We talked about his bouts of agitation and about his food aggression. Mom is usually able to redirect him but he sometimes needs the Diazepam to help him to be calmer. Mom is devoted to his care and I commended her for the how she manages his mood and behaviors. I will see Christian Macho in follow up in 1 year or sooner if needed.   The medication list was reviewed and reconciled. No changes were made in the prescribed medications today. A complete medication list was provided to the patient.  Allergies as of 07/27/2020      Reactions   Depakote [divalproex Sodium] Other (See Comments)   Toxicity and worsening seizures - occurred in childhood - prior to starting Trileptal in 2001   Prozac [fluoxetine Hcl] Other (See Comments)   Increase in obsessive compulsive behavior   Carbamazepine And Analogs    Other reaction(s): Unknown   Chicken Allergy    Other reaction(s): Unknown   Eggs Or Egg-derived Products    Other reaction(s): Unknown      Medication List        Accurate as of July 27, 2020 11:59 PM. If you have any questions, ask your nurse or doctor.        STOP taking these medications   alum & mag hydroxide-simeth 400-400-40 MG/5ML suspension Commonly known as: MAALOX PLUS Stopped by: Elveria Rising, NP   CANNABIDIOL PO Stopped by: Elveria Rising, NP   ibuprofen 200 MG tablet Commonly known as: ADVIL Stopped by: Elveria Rising, NP   pantoprazole 20 MG tablet Commonly known as: PROTONIX Stopped by: Elveria Rising, NP     TAKE these medications   diazePAM 5 MG/5ML Soln GIVE 3 MLS ONCE DAILY AS NEEDED FOR AGITATION   escitalopram 10 MG tablet Commonly known as: LEXAPRO Take 1 tablet (10 mg total) by mouth daily.   Trileptal  300 MG tablet Generic drug: Oxcarbazepine TAKE 1&1/2 TABLETS BY MOUTH TWICE A DAY       Total time spent with the patient was 25 minutes, of which 50% or more was spent in counseling and coordination of care.  Elveria Rising NP-C [facility] Ph. [phone] Fax [phone]

## 2020-07-29 MED ORDER — DIAZEPAM 5 MG/5ML PO SOLN
ORAL | 5 refills | Status: DC
Start: 2020-07-29 — End: 2021-08-29

## 2020-07-29 MED ORDER — TRILEPTAL 300 MG PO TABS
ORAL_TABLET | ORAL | 5 refills | Status: DC
Start: 2020-07-29 — End: 2020-09-19

## 2020-07-31 ENCOUNTER — Encounter (INDEPENDENT_AMBULATORY_CARE_PROVIDER_SITE_OTHER): Payer: Self-pay | Admitting: Family

## 2020-07-31 NOTE — Patient Instructions (Signed)
Thank you for meeting with me by video today.   Instructions for you until your next appointment are as follows: 1. Continue giving Christian Preston the medications as prescribed 2. Let me know if he has any seizures 3. Please sign up for MyChart if you have not done so.MyChart should be used for:  -non-urgent prescription refills  -non-urgent scheduling issues -non-urgent general questions   Please DO NOT use MyChart for URGENT messages, as messages are only checked during weekday regular business hours. You should receive a response from our care team within 2 business days.   4. Please plan to return for follow up in one year or sooner if needed.

## 2020-08-12 ENCOUNTER — Other Ambulatory Visit (INDEPENDENT_AMBULATORY_CARE_PROVIDER_SITE_OTHER): Payer: Self-pay | Admitting: Family

## 2020-08-12 DIAGNOSIS — R451 Restlessness and agitation: Secondary | ICD-10-CM

## 2020-08-12 DIAGNOSIS — R4681 Obsessive-compulsive behavior: Secondary | ICD-10-CM

## 2020-09-17 ENCOUNTER — Other Ambulatory Visit (INDEPENDENT_AMBULATORY_CARE_PROVIDER_SITE_OTHER): Payer: Self-pay | Admitting: Family

## 2020-09-17 DIAGNOSIS — G40309 Generalized idiopathic epilepsy and epileptic syndromes, not intractable, without status epilepticus: Secondary | ICD-10-CM

## 2020-09-17 DIAGNOSIS — G40209 Localization-related (focal) (partial) symptomatic epilepsy and epileptic syndromes with complex partial seizures, not intractable, without status epilepticus: Secondary | ICD-10-CM

## 2020-09-19 NOTE — Telephone Encounter (Signed)
Please send to the pharmacy °

## 2020-10-18 ENCOUNTER — Telehealth (INDEPENDENT_AMBULATORY_CARE_PROVIDER_SITE_OTHER): Payer: Self-pay | Admitting: Family

## 2020-10-18 NOTE — Telephone Encounter (Signed)
I called and spoke with Mom. She said that [name]'s application for home modification was transferred to [facility] and that some documents were lost. She needs updated letter sent to her to provide to [place]. Mom asked for the letter to be emailed to her at [email or web address], which I will do. TG

## 2020-10-18 NOTE — Telephone Encounter (Signed)
Who's calling (name and relationship to patient) : jadien lehigh mom   Best contact number: [phone]  Provider they see: Elveria Rising  Reason for call: Mom states that the company doing home modification has gone through changes. In those changes information has been lost and mom is trying to gather all the information to hand back into them. Please call to discuss next steps.   Call ID:      PRESCRIPTION REFILL ONLY  Name of prescription:  Pharmacy:

## 2020-12-25 ENCOUNTER — Encounter (INDEPENDENT_AMBULATORY_CARE_PROVIDER_SITE_OTHER): Payer: Self-pay

## 2021-02-15 ENCOUNTER — Ambulatory Visit: Payer: 59 | Attending: Family Medicine

## 2021-02-15 ENCOUNTER — Other Ambulatory Visit: Payer: Self-pay

## 2021-02-15 VITALS — Wt 219.0 lb

## 2021-02-15 DIAGNOSIS — R262 Difficulty in walking, not elsewhere classified: Secondary | ICD-10-CM | POA: Diagnosis not present

## 2021-02-15 DIAGNOSIS — R2689 Other abnormalities of gait and mobility: Secondary | ICD-10-CM

## 2021-02-16 NOTE — Therapy (Addendum)
[facility] [address] [place], [place], [address] Phone: [phone]   Fax:  [phone]  Physical Therapy Evaluation  Patient Details  Name: Christian Preston MRN: [id number] Date of Birth: June 30, 1996 Referring Provider (PT): Benedetto Goad   Encounter Date: 02/15/2021   PT End of Session - 02/15/21 1416     Visit Number 1    Number of Visits 1    PT Start Time 0955    PT Stop Time 1130    PT Time Calculation (min) 95 min    Behavior During Therapy Restless;Impulsive             Past Medical History:  Diagnosis Date   Autistic disorder, current or active state    Seizures (HCC)     History reviewed. No pertinent surgical history.  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 He is non verbal and all subjective reports are provided by mother. She reports that she has a difficult time manageing ADLs such as bathing  and transfers because she and the caregiver have limited range or mobility to access the rest room facilities. He has one 4" step at the front of the house without hand rails which he must negotiate by feeling for foot placement due to impaired depth perception. He is unable to independently navigate the step. The backyard is screened in but due to cogitive delays and impulsivity, he frequenty runs and has detached the current screen door from frame . There is a step 4" step that he is not able to navigate independently at the back porch as well.  Additionally there is a 7-8" step off of the back patio from which he is not able to safely step down and due to his lack of safety awareness, the family is concerned that he may fall from the patio. There is a screen door that is intended to keep [name] safe however he is able to and frequntly pushes through te door which has caused a gap in the the frame and door  placement. She and cargiver request for railing to prevent patient from stepping, tripping, falling. He has a walk in shower without any grab bars. He has a small shower chair but it is much smaller and mother states that he does not feel comfortable in the shower. She states that for his safety, he has to keep the shower curtain open but this has caused water damage on the floor and shower frame, and walls. There is no vent in that room. She has had a home assessment through a contractor with [facility] named Daisy Floro [phone]; recommendations thus far are placement of several grab bars, railing inside and outside of the house, and other safety modifications at home.    Pertinent History Autism, non-verbal, congential valgus deformity of bilateral feet, depth perception deficits    Limitations Walking;House hold activities;Other (comment)   transfers   Patient Stated Goals Increased independence and safety with navigating his home and participating in ADLs  Currently in Pain? No/denies                [facility] PT Assessment - 02/15/21 0945       Assessment   Medical Diagnosis Bil congenital valgus deformty of feet,  Autism, Cerebral Atrophy,    Referring Provider (PT) Benedetto Goad    Onset Date/Surgical Date 06/12/1996      Precautions   Precautions Fall    Precaution Comments Impulsivity and depth perception deficits    Required Braces or Orthoses Other Brace/Splint    Other Brace/Splint hard shell SMO      Home Environment   Living Environment Private residence    [employer], trade [employer];Other (Comment)    Type of  Home House    Home Access Stairs to enter    Entrance Stairs-Rails None    Home Layout One level    Home Equipment Shower seat      Prior Function   Level of Independence Needs assistance with ADLs;Needs assistance with transfers    Level of Independence - Bath Maximal    Toileting Moderate    Dressing Moderate    Grooming Maximal      Cognition   Overall Cognitive Status History of cognitive impairments - at baseline    Attention Divided    Awareness Impaired    Behaviors Restless;Impulsive;Verbal agitation;Perseveration;Poor frustration tolerance   expresses frustration by running     Observation/Other Assessments   Observations exotropia strabismus    Skin Integrity callous and abraision at medial [facility]      Functional Tests   Functional tests Sit to Stand;Step up;Step down      Step Up   Comments Unable to place foot on step - overshot by 2" or greater off side of step with initial step      Step Down   Comments difficulty d/t depth perception deficits - patent hesitantly places foot down by tapping to search for contact.      Sit to Stand   Comments independent without use of UE      Posture/Postural Control   Posture/Postural Control Postural limitations    Postural Limitations Rounded Shoulders    Posture Comments R convex thoracic spine, R hip anteriorly rotated      Tone   Assessment Location Right Upper Extremity      ROM / Strength   AROM / PROM / Strength Strength      Strength   Overall Strength Unable to assess;Due to impaired cognition      Bed Mobility   Bed Mobility Supine to Sit;Sit to Supine    Supine to Sit Minimal Assistance - Patient > 75%;Set up assist    Sit to Supine Minimal Assistance - Patient > 75%;Contact Guard/Touching assist      Transfers   Transfers --   Car transfer   Comments requires min assist for LE placement      Ambulation/Gait   Gait Comments limited without ankle orthoses - applies weight to medial malleolus                  Objective measurements completed on examination: See above findings.        Plan - 02/15/21 1410     Clinical Impression Statement Silvia Markuson is a pleasant 25 yo male with autism disorder and congenital bilteral valgus foot deformity. He is non-verbal throughout assessment and inattentive to instruction. He repsonds to certain tactile cues provided by mother however requires significant recovery from therapist and caregiver  to prevent frequent impulsive behaviors (walking/running away from evaluation.) Patient demonstrates limited transfer ability with needs for cuing to perform sit to stand, sit to supine, and supine to sit. He additionally presents with limitations in navigating steps with a side steppage gait pattern and a repeated tapping of the foot to find the step below. He has a freezing of gait when crossing threhold of doorways and requires verbal cues and hand held assist to coach him through safely - moderate agitation noted with verbal perseveration. His step-to step and crouched pattern requires MinA x 2 to prevent falls while attempting 3 steps downward. Due to present limitations, patient is not appropriate for skilled PT in the outpaient setting. Due to presentaion of physical limitations and safety awareness deficits, modifications for the home are recommended to be assessed and provided as needded.    Personal Factors and Comorbidities Behavior Pattern;Comorbidity 1;Comorbidity 2;Comorbidity 3+    Examination-Activity Limitations Bed Mobility;Bathing;Locomotion Level;Self Feeding;Toileting;Transfers;Stairs    Examination-Participation Restrictions Driving;Interpersonal Relationship;Other    Stability/Clinical Decision Making Unstable/Unpredictable    Clinical Decision Making High    PT Frequency 1x / week    PT Next Visit Plan 1/1 Eval only. Skilled PT not recommended at this time.    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 [place], [place], [address] Phone: [phone]   Fax:  [phone]  Name: MACYN REMMERT MRN: [id number] Date of Birth: [birth year]

## 2021-02-21 ENCOUNTER — Telehealth (INDEPENDENT_AMBULATORY_CARE_PROVIDER_SITE_OTHER): Payer: Self-pay | Admitting: Family

## 2021-02-21 NOTE — Telephone Encounter (Signed)
Please advise 

## 2021-02-21 NOTE — Telephone Encounter (Signed)
Who's calling (name and relationship to patient) : Christian Preston mom   Best contact number: [phone]  Provider they see: Elveria Rising   Reason for call: Patient's mother needs the letter that would help them get home modifications updated with a more recent date because all of these modifications have been delayed. Mom would like to know how to get this letter sent to the right place.   Call ID:      PRESCRIPTION REFILL ONLY  Name of prescription:  Pharmacy:

## 2021-02-22 NOTE — Telephone Encounter (Signed)
I called and spoke with Mom. She said that [employer] has changed to [employer] and that the information previously gathered for home modifications for [name] have been misplaced. She needs the letter that was written in March to be emailed to her and to his new coordinator, Donnetta Simpers with [facility] at [email or web address]. I emailed the letter as requested. TG

## 2021-03-30 ENCOUNTER — Telehealth (INDEPENDENT_AMBULATORY_CARE_PROVIDER_SITE_OTHER): Payer: Self-pay | Admitting: Family

## 2021-03-30 NOTE — Telephone Encounter (Signed)
  Who's calling (name and relationship to patient) :Monier,Janet (Mother)  Best contact number: [phone] (Home) Provider they see: Elveria Rising, NP Reason for call: Mom dropped of documents to be completed, placed in [employer] box. Please fax over completed forms to [phone] and contact mom when completed.     PRESCRIPTION REFILL ONLY  Name of prescription:  Pharmacy:

## 2021-03-30 NOTE — Telephone Encounter (Signed)
Form given to provider for completion and signature.

## 2021-04-05 NOTE — Telephone Encounter (Signed)
Faxed forms to number provided and called mom and left message to inform that it had been sent.

## 2021-04-05 NOTE — Telephone Encounter (Signed)
The FMLA form has been completed. Ellie, please fax as requested. Thanks, Inetta Fermo

## 2021-06-02 ENCOUNTER — Telehealth (INDEPENDENT_AMBULATORY_CARE_PROVIDER_SITE_OTHER): Payer: Self-pay | Admitting: Family

## 2021-06-02 DIAGNOSIS — G40209 Localization-related (focal) (partial) symptomatic epilepsy and epileptic syndromes with complex partial seizures, not intractable, without status epilepticus: Secondary | ICD-10-CM

## 2021-06-02 DIAGNOSIS — G40309 Generalized idiopathic epilepsy and epileptic syndromes, not intractable, without status epilepticus: Secondary | ICD-10-CM

## 2021-06-02 MED ORDER — TRILEPTAL 300 MG PO TABS
ORAL_TABLET | ORAL | 2 refills | Status: DC
Start: 2021-06-02 — End: 2021-07-11

## 2021-06-02 NOTE — Telephone Encounter (Signed)
Who's calling (name and relationship to patient) : cade dashner mom   Best contact number: [phone]  Provider they see: Elveria Rising  Reason for call: Mom states she has been trying to get Rx refilled all week. Front office rep sees no documentation for request therefore started new note  Call ID:      PRESCRIPTION REFILL ONLY  Name of prescription: Trileptal  Pharmacy: [facility]

## 2021-07-11 ENCOUNTER — Other Ambulatory Visit (INDEPENDENT_AMBULATORY_CARE_PROVIDER_SITE_OTHER): Payer: Self-pay

## 2021-07-11 MED ORDER — TRILEPTAL 300 MG PO TABS: ORAL_TABLET | ORAL | 2 refills | Status: DC

## 2021-07-11 NOTE — Telephone Encounter (Signed)
Printed- signed by Dr. Devonne Doughty and faxed to [facility]

## 2021-07-11 NOTE — Addendum Note (Signed)
Addended by: Princella Ion on: 07/11/2021 12:36 PM   Modules accepted: Orders

## 2021-07-11 NOTE — Telephone Encounter (Signed)
[employer] documents have been received mom is hoping that Christian Preston can call in the medication because the pharmacy is stating she needs to pay unless Preston calls in rx

## 2021-07-11 NOTE — Addendum Note (Signed)
Addended by: Vita Barley B on: 07/11/2021 04:08 PM   Modules accepted: Orders

## 2021-07-12 ENCOUNTER — Telehealth (INDEPENDENT_AMBULATORY_CARE_PROVIDER_SITE_OTHER): Payer: Self-pay | Admitting: Family

## 2021-07-12 NOTE — Telephone Encounter (Signed)
PA was required through [facility]. PA submitted through [employer] with mom and let her know. Mom states understanding and gratitude before ending the call.

## 2021-07-12 NOTE — Telephone Encounter (Signed)
Patient's mother states that they are still waiting for a PA for medication. Mom still needs medication for patient. Please call mom to update her on where she is in the process because the pharmacy keeps showing that she owes a lot of money for the medication.

## 2021-07-12 NOTE — Telephone Encounter (Signed)
Duplicate encounter. See telephone for further details

## 2021-07-12 NOTE — Telephone Encounter (Signed)
Who's calling (name and relationship to patient) :Mother   Best contact number [phone]  Provider they see: tina   Reason for call:Son is needing PA on medication for refill    Call ID: [id number]     PRESCRIPTION REFILL ONLY  Name of prescription:  Pharmacy:

## 2021-08-29 ENCOUNTER — Encounter (INDEPENDENT_AMBULATORY_CARE_PROVIDER_SITE_OTHER): Payer: Self-pay | Admitting: Family

## 2021-08-29 ENCOUNTER — Ambulatory Visit (INDEPENDENT_AMBULATORY_CARE_PROVIDER_SITE_OTHER): Payer: 59 | Admitting: Family

## 2021-08-29 ENCOUNTER — Other Ambulatory Visit: Payer: Self-pay

## 2021-08-29 VITALS — Wt 222.0 lb

## 2021-08-29 DIAGNOSIS — R451 Restlessness and agitation: Secondary | ICD-10-CM | POA: Diagnosis not present

## 2021-08-29 DIAGNOSIS — F840 Autistic disorder: Secondary | ICD-10-CM

## 2021-08-29 DIAGNOSIS — G40309 Generalized idiopathic epilepsy and epileptic syndromes, not intractable, without status epilepticus: Secondary | ICD-10-CM | POA: Diagnosis not present

## 2021-08-29 DIAGNOSIS — G40209 Localization-related (focal) (partial) symptomatic epilepsy and epileptic syndromes with complex partial seizures, not intractable, without status epilepticus: Secondary | ICD-10-CM

## 2021-08-29 DIAGNOSIS — Q999 Chromosomal abnormality, unspecified: Secondary | ICD-10-CM

## 2021-08-29 DIAGNOSIS — K219 Gastro-esophageal reflux disease without esophagitis: Secondary | ICD-10-CM

## 2021-08-29 MED ORDER — DIAZEPAM 5 MG PO TABS: 5.0000 mg | ORAL_TABLET | Freq: Every day | ORAL | 5 refills | Status: DC | PRN

## 2021-08-29 MED ORDER — OMEPRAZOLE 20 MG PO CPDR: 20.0000 mg | DELAYED_RELEASE_CAPSULE | Freq: Two times a day (BID) | ORAL | 5 refills | Status: DC

## 2021-08-29 NOTE — Patient Instructions (Addendum)
It was a pleasure to see you today!  Instructions for you until your next appointment are as follows: Start generic Prilosec 20mg  in the morning and at night I have changed the Diazepam to tablets. Let me know if you need anything for the home modifications Please sign up for MyChart if you have not done so. Please plan to return for follow up in one year or sooner if needed.    Feel free to contact our office during normal business hours at [phone] with questions or concerns. If there is no answer or the call is outside business hours, please leave a message and our clinic staff will call you back within the next business day.  If you have an urgent concern, please stay on the line for our after-hours answering service and ask for the on-call neurologist.     I also encourage you to use MyChart to communicate with me more directly. If you have not yet signed up for MyChart within [facility], the front desk staff can help you. However, please note that this inbox is NOT monitored on nights or weekends, and response can take up to 2 business days.  Urgent matters should be discussed with the on-call pediatric neurologist.   At [facility], we are committed to providing exceptional care. You will receive a patient satisfaction survey through text or email regarding your visit today. Your opinion is important to me. Comments are appreciated.

## 2021-09-01 NOTE — Progress Notes (Signed)
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 He is taking and tolerating Trileptal and has remained seizure free since June 2000. Christian Preston has ongoing problems mood, low frustration tolerance, and self directed aggression. Lexapro has helped with his mood and he has Diazepam for bouts of agitation  Today's concerns: Mom reports today that Christian Preston doesn't like the liquid Diazepam and wants the prescription can be changed to tablets.   Mom also reports that Christian Preston has problems with burping and what seems to be abdominal pain, and wonders if he should restart treatment for GERD. He used to take Prilosec but stopped it when the prescription expired.   When Christian Preston was last seen, the family had applied for some home modifications for him and said that with changes in Medicaid that the initial application was lost and that they have had to reapply.   Christian Preston has been otherwise generally healthy since he was last seen. He is cared for at home by his family and by a day worker. Mom has no other health concerns for Christian Preston today other than previously mentioned.  Review of systems: Please see HPI for neurologic and other pertinent review of systems. Otherwise all other systems were reviewed and were negative.  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   Family history: family history includes Diabetes in his paternal grandmother; Heart Problems in his paternal grandmother.   Social history: Social History   Socioeconomic History   Marital status: Single    Spouse name: Not on file   Number of children: Not on file   Years of education: Not on file   Highest education level: Not on file  Occupational History   Not on file  Tobacco Use   Smoking status: Never   Smokeless tobacco: Never  Vaping Use   Vaping Use: Never used  Substance and Sexual Activity   Alcohol use: No   Drug use: No   Sexual activity: Never  Other Topics Concern   Not on file  Social History Narrative   Christian Preston is part of [facility] program but is not currently attending due to their mask requirements. He enjoys movies and music. He lives with his parents.    Social Determinants of Health   Financial Resource Strain: Not on file  Food Insecurity: Not on file  Transportation Needs: Not on file  Physical Activity: Not  on file  Stress: Not on file  Social Connections: Not on file  Intimate Partner Violence: Not on file    Past/failed meds: Copied from previous record: Diazepam suspension - did not like the taste Depakote - toxicity and worsening seizures Fluoxetine - increased OCD behaviors Carbamazepine - changes in behavior  Allergies: Allergies  Allergen Reactions   Depakote [Divalproex Sodium] Other (See Comments)    Toxicity and  worsening seizures - occurred in childhood - prior to starting Trileptal in 2001   Prozac [Fluoxetine Hcl] Other (See Comments)    Increase in obsessive compulsive behavior   Carbamazepine And Analogs     Other reaction(s): Unknown   Chicken Allergy     Other reaction(s): Unknown   Eggs Or Egg-Derived Products     Other reaction(s): Unknown     Immunizations: Immunization History  Administered Date(s) Administered   Influenza Split 06/22/2014   Influenza, Seasonal, Injecte, Preservative Fre 05/11/2013   Influenza,inj,Quad PF,6+ Mos 06/18/2017, 09/03/2018, 05/13/2019, 08/02/2020   Influenza-Unspecified 07/21/2015, 06/11/2016    Diagnostics/Screenings: Copied from previous record: MRI Brain wo contrast- 09-10-2001 [facility] - Subtle confluent area of abnormal increased T2 signal in the left periventricular white matter adjacent to the atrium and occipital horn of the left lateral ventricle. Fairly normal myelination pattern seen in the motor strips bilaterally. The remainder of the myelination pattern, the frontal and parietal lobes is markedly delayed for patient's age. The myelination pattern correlates to a  patient of less than 1 year of age. Small right maxillary sinus mucous retention cyst. Mild bilateral cerebral atrophy more than anticipated for patient's age. No distinct intracranial lesions. The medial temporal lobes and hippocampi are otherwise normal. The CSF pathways and ventricles are normal. The remainder of the brain parenchyma is normal. The calvarium is normal. The orbital structures are normal.  The intracranial  flow voids are normal. No areas of restricted diffusion on the DWI sequence to suggest an acute infarct.  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   Physical Exam: Wt 222 lb (100.7 kg)    BMI 32.31 kg/m   General: well developed, well nourished man, seated in exam room, in no  evident distress Head: normocephalic and atraumatic. Unable to examine oropharynx due to his inability to participate in examination. No dysmorphic features. Neck: supple Cardiovascular: regular rate and rhythm, no murmurs. Respiratory: clear to auscultation bilaterally Musculoskeletal: no skeletal deformities or obvious scoliosis. Skin: no rashes or neurocutaneous lesions  Neurologic Exam Mental Status: awake and fully alert. Has no language. Restless and requires frequent redirection by his worker with him today. Resistant to invasions in to his space Cranial Nerves: turns to localize faces and objects in the periphery. Turns to localize sounds in the periphery. Facial movements are symmetric Motor: normal functional bulk, tone and strength Sensory: withdrawal x 4 Coordination: unable to adequately assess due to patient's inability to participate in examination. No dysmetria when reaching for objects. 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 Christian Preston has neither had nor required imaging or lab studies since the last visit. 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 He is taking and tolerating Trileptal and has remained seizure free since June 2000. He has ongoing problems with restlessness, easy frustration and self directed aggressive behavior. Diazepam helps when these behaviors but he does not like the suspension. Mom feels that she can get him to take  Diazepam tablets in a bite of food and I will change his prescription for that.   We talked about his abdominal pain and burping and I recommended restarting Prilosec.   Christian Preston needs some home modifications for his care and safety, and I will be happy to help with this process if needed.   I asked Mom to let me know if he has any seizures or if she has other concerns. I will otherwise see him back in follow up in 1 year or sooner if needed.   The medication list was reviewed and reconciled. I reviewed the changes that were made in the prescribed medications today. A complete medication list was provided to the patient.  Return in about 1 year (around 08/29/2022).   Allergies as of 08/29/2021       Reactions   Depakote [divalproex Sodium] Other (See Comments)   Toxicity and worsening seizures - occurred in childhood - prior to starting Trileptal in 2001   Prozac [fluoxetine Hcl] Other (See Comments)   Increase in obsessive compulsive behavior   Carbamazepine And Analogs    Other reaction(s): Unknown   Chicken Allergy    Other reaction(s): Unknown   Eggs Or Egg-derived Products    Other reaction(s): Unknown        Medication List        Accurate as of August 29, 2021 11:59 PM. If you have any questions, ask your nurse or doctor.          STOP taking these medications    diazePAM 5 MG/5ML Soln Replaced by: diazepam 5 MG tablet Stopped by: Elveria Rising, NP       TAKE these medications    diazepam 5 MG tablet Commonly known as: VALIUM Take 1 tablet (5 mg total) by mouth daily as needed for anxiety. Replaces: diazePAM 5 MG/5ML Soln Started by: Elveria Rising, NP   escitalopram 10 MG tablet Commonly known as: LEXAPRO TAKE 1 TABLET BY MOUTH EVERY DAY   omeprazole 20 MG capsule Commonly known as: PRILOSEC Take 1 capsule (20 mg total) by mouth 2 (two) times daily before a meal. Started by: Elveria Rising, NP   Trileptal 300 MG tablet Generic drug:  Oxcarbazepine TAKE 1.5 TABLETS BY MOUTH TWICE A DAY      Total time spent with the patient was 20 minutes, of which 50% or more was spent in counseling and coordination of care.  Elveria Rising NP-C [facility] Ph. [phone] Fax [phone]

## 2021-09-02 ENCOUNTER — Encounter (INDEPENDENT_AMBULATORY_CARE_PROVIDER_SITE_OTHER): Payer: Self-pay | Admitting: Family

## 2021-09-02 DIAGNOSIS — K219 Gastro-esophageal reflux disease without esophagitis: Secondary | ICD-10-CM | POA: Insufficient documentation

## 2021-10-21 ENCOUNTER — Other Ambulatory Visit: Payer: Self-pay | Admitting: Neurology

## 2021-10-21 DIAGNOSIS — G40209 Localization-related (focal) (partial) symptomatic epilepsy and epileptic syndromes with complex partial seizures, not intractable, without status epilepticus: Secondary | ICD-10-CM

## 2021-10-21 DIAGNOSIS — G40309 Generalized idiopathic epilepsy and epileptic syndromes, not intractable, without status epilepticus: Secondary | ICD-10-CM

## 2021-10-24 ENCOUNTER — Other Ambulatory Visit (INDEPENDENT_AMBULATORY_CARE_PROVIDER_SITE_OTHER): Payer: Self-pay | Admitting: Family

## 2021-10-24 DIAGNOSIS — G40309 Generalized idiopathic epilepsy and epileptic syndromes, not intractable, without status epilepticus: Secondary | ICD-10-CM

## 2021-10-24 DIAGNOSIS — G40209 Localization-related (focal) (partial) symptomatic epilepsy and epileptic syndromes with complex partial seizures, not intractable, without status epilepticus: Secondary | ICD-10-CM

## 2021-10-24 DIAGNOSIS — R451 Restlessness and agitation: Secondary | ICD-10-CM

## 2021-10-24 DIAGNOSIS — R4681 Obsessive-compulsive behavior: Secondary | ICD-10-CM

## 2021-10-24 MED ORDER — ESCITALOPRAM OXALATE 10 MG PO TABS: 10.0000 mg | ORAL_TABLET | Freq: Every day | ORAL | 1 refills | Status: DC

## 2021-10-24 NOTE — Telephone Encounter (Signed)
Contacted pharmacy as Boone Master was sent yesterday by Dr. Jordan Hawks.  ? ?Pharmacy informs they received the prescription and were able to run it without authorization, but it is an $800 copay.  ?

## 2021-10-24 NOTE — Telephone Encounter (Signed)
?  Who's calling (name and relationship to patient) :Marylu Lund (mom) ? ?Best contact number: ?[phone] ?Provider they see: ?[facility] ?Reason for call: ? ?Please send in rx, due to insurance require authorization from provider  ? ? ?PRESCRIPTION REFILL ONLY ? ?Name of prescription: ?Trileptal and Lexapro ?Pharmacy: ? ?[facility] [phone] ?

## 2021-10-24 NOTE — Telephone Encounter (Signed)
I called the pharmacy. This did not go through because Trileptal is a brand name medication and must be printed, hand written on it "Brand Medically Necessary and faxed to the pharmacy. That is why it was processed as it was. They gave the patient a few tablets to last until tomorrow. I will print, prepare and fax a prescription as is required tomorrow morning when I return to the office. TG ?

## 2021-10-25 MED ORDER — TRILEPTAL 300 MG PO TABS: ORAL_TABLET | ORAL | 5 refills | Status: DC

## 2022-03-24 ENCOUNTER — Other Ambulatory Visit (INDEPENDENT_AMBULATORY_CARE_PROVIDER_SITE_OTHER): Payer: Self-pay | Admitting: Family

## 2022-03-24 DIAGNOSIS — R451 Restlessness and agitation: Secondary | ICD-10-CM

## 2022-03-24 DIAGNOSIS — R4681 Obsessive-compulsive behavior: Secondary | ICD-10-CM

## 2022-05-04 ENCOUNTER — Telehealth (INDEPENDENT_AMBULATORY_CARE_PROVIDER_SITE_OTHER): Payer: Self-pay | Admitting: Family

## 2022-05-04 DIAGNOSIS — F840 Autistic disorder: Secondary | ICD-10-CM

## 2022-05-04 DIAGNOSIS — N3942 Incontinence without sensory awareness: Secondary | ICD-10-CM

## 2022-05-04 NOTE — Telephone Encounter (Addendum)
  Name of who is calling: Princella Ion Relationship to Patient: mom  Best contact number: [phone]  Provider they see: Elveria Rising  Reason for call: New prescription needs to be sent to [facility] Attn to Francia Greaves, fax # [phone] for his prescribed diapers 147 quilted adult brief w/wings pls and  45 prevail breezers (diapers) and 31 under pads     PRESCRIPTION REFILL ONLY  Name of prescription:  Pharmacy:

## 2022-05-07 NOTE — Addendum Note (Signed)
Addended by: Joelyn Oms on: 05/07/2022 03:22 PM   Modules accepted: Orders

## 2022-05-07 NOTE — Telephone Encounter (Signed)
I have put in the order. [name], please fax to [facility] DME department. Thanks, Otila Kluver

## 2022-05-08 ENCOUNTER — Telehealth (INDEPENDENT_AMBULATORY_CARE_PROVIDER_SITE_OTHER): Payer: Self-pay

## 2022-05-08 NOTE — Telephone Encounter (Signed)
Fax was successfully sent to [facility] 9.18.2023 @ 4:51PM  Job #: [id number]  SS, CCMA

## 2022-06-07 ENCOUNTER — Telehealth (INDEPENDENT_AMBULATORY_CARE_PROVIDER_SITE_OTHER): Payer: Self-pay | Admitting: Family

## 2022-06-07 DIAGNOSIS — E6609 Other obesity due to excess calories: Secondary | ICD-10-CM

## 2022-06-07 DIAGNOSIS — Q666 Other congenital valgus deformities of feet: Secondary | ICD-10-CM

## 2022-06-07 DIAGNOSIS — Z9189 Other specified personal risk factors, not elsewhere classified: Secondary | ICD-10-CM

## 2022-06-07 DIAGNOSIS — F840 Autistic disorder: Secondary | ICD-10-CM

## 2022-06-07 DIAGNOSIS — G40309 Generalized idiopathic epilepsy and epileptic syndromes, not intractable, without status epilepticus: Secondary | ICD-10-CM

## 2022-06-07 NOTE — Telephone Encounter (Signed)
  Name of who is calling: Trisha Mangle Relationship to Patient: Mom  Best contact number: [phone]  Provider they see: Rockwell Germany   Reason for call: Mom is calling to get prescription for [place] controlled power assistant wheelchair. She's requesting a callback     [place]  Name of prescription:  Pharmacy:

## 2022-06-07 NOTE — Telephone Encounter (Signed)
I called and spoke to Mom. 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 His parents have trouble pushing him in a regular wheelchair because of his weight. I told Mom that I will send the order to [employer] to start the process. He may need an office visit to document medical necessity. TG

## 2022-07-09 ENCOUNTER — Telehealth (INDEPENDENT_AMBULATORY_CARE_PROVIDER_SITE_OTHER): Payer: Self-pay | Admitting: Family

## 2022-07-09 NOTE — Telephone Encounter (Signed)
[facility] (Care coordinator at [facility]) came to drop off a form Certification of medical Necessity to be filled out.  She asked for you to call her when ready [phone] I am placing this in your box.

## 2022-07-09 NOTE — Telephone Encounter (Signed)
I called regarding the form. The form was blank other so I called to clarify what home modification was being requested. Krista Blue will send me an updated form. TG

## 2022-07-10 NOTE — Telephone Encounter (Signed)
The updated form was received, completed and returned to [facility] as requested. TG

## 2022-08-01 ENCOUNTER — Other Ambulatory Visit: Payer: Self-pay | Admitting: Neurology

## 2022-08-01 ENCOUNTER — Other Ambulatory Visit (INDEPENDENT_AMBULATORY_CARE_PROVIDER_SITE_OTHER): Payer: Self-pay | Admitting: Family

## 2022-08-01 DIAGNOSIS — G40309 Generalized idiopathic epilepsy and epileptic syndromes, not intractable, without status epilepticus: Secondary | ICD-10-CM

## 2022-08-01 DIAGNOSIS — G40209 Localization-related (focal) (partial) symptomatic epilepsy and epileptic syndromes with complex partial seizures, not intractable, without status epilepticus: Secondary | ICD-10-CM

## 2022-08-02 ENCOUNTER — Other Ambulatory Visit (INDEPENDENT_AMBULATORY_CARE_PROVIDER_SITE_OTHER): Payer: Self-pay | Admitting: Family

## 2022-08-02 DIAGNOSIS — R4681 Obsessive-compulsive behavior: Secondary | ICD-10-CM

## 2022-08-02 DIAGNOSIS — R451 Restlessness and agitation: Secondary | ICD-10-CM

## 2022-08-02 NOTE — Telephone Encounter (Signed)
Seen 08/29/21 follow up due in 1 yr not sched. Requested Denasia call and sched follow up

## 2022-08-02 NOTE — Telephone Encounter (Signed)
  Name of who is calling: Princella Ion Relationship to Patient: mom   Best contact number: [phone]  Provider they see: Elveria Rising  Reason for call: Mom went to the pharmacy and she wasn't able to get his prescription. Trileptal, diazepam. And lexapro.     PRESCRIPTION REFILL ONLY  Name of prescription: trileptal, diazepam. And lexapro.  [facility] [place]

## 2022-08-02 NOTE — Telephone Encounter (Signed)
Mom stated pt will run out of his seizure medication this weekend.

## 2022-08-03 MED ORDER — DIAZEPAM 5 MG PO TABS: 5.0000 mg | ORAL_TABLET | Freq: Every day | ORAL | 5 refills | Status: DC | PRN

## 2022-08-03 MED ORDER — ESCITALOPRAM OXALATE 10 MG PO TABS: 10.0000 mg | ORAL_TABLET | Freq: Every day | ORAL | 1 refills | Status: DC

## 2022-08-23 ENCOUNTER — Encounter (INDEPENDENT_AMBULATORY_CARE_PROVIDER_SITE_OTHER): Payer: Self-pay | Admitting: Family

## 2022-08-23 ENCOUNTER — Ambulatory Visit (INDEPENDENT_AMBULATORY_CARE_PROVIDER_SITE_OTHER): Payer: 59 | Admitting: Family

## 2022-08-23 VITALS — BP 110/64 | Ht 68.9 in | Wt 214.2 lb

## 2022-08-23 DIAGNOSIS — Q999 Chromosomal abnormality, unspecified: Secondary | ICD-10-CM

## 2022-08-23 DIAGNOSIS — R451 Restlessness and agitation: Secondary | ICD-10-CM

## 2022-08-23 DIAGNOSIS — R4681 Obsessive-compulsive behavior: Secondary | ICD-10-CM

## 2022-08-23 DIAGNOSIS — F840 Autistic disorder: Secondary | ICD-10-CM

## 2022-08-23 DIAGNOSIS — K219 Gastro-esophageal reflux disease without esophagitis: Secondary | ICD-10-CM

## 2022-08-23 DIAGNOSIS — G40109 Localization-related (focal) (partial) symptomatic epilepsy and epileptic syndromes with simple partial seizures, not intractable, without status epilepticus: Secondary | ICD-10-CM

## 2022-08-23 DIAGNOSIS — Z9189 Other specified personal risk factors, not elsewhere classified: Secondary | ICD-10-CM

## 2022-08-23 DIAGNOSIS — G40209 Localization-related (focal) (partial) symptomatic epilepsy and epileptic syndromes with complex partial seizures, not intractable, without status epilepticus: Secondary | ICD-10-CM

## 2022-08-23 DIAGNOSIS — G40309 Generalized idiopathic epilepsy and epileptic syndromes, not intractable, without status epilepticus: Secondary | ICD-10-CM

## 2022-08-23 DIAGNOSIS — G40909 Epilepsy, unspecified, not intractable, without status epilepticus: Secondary | ICD-10-CM

## 2022-08-23 DIAGNOSIS — N3942 Incontinence without sensory awareness: Secondary | ICD-10-CM

## 2022-08-23 MED ORDER — ESCITALOPRAM OXALATE 10 MG PO TABS: 10.0000 mg | ORAL_TABLET | Freq: Every day | ORAL | 1 refills | Status: DC

## 2022-08-23 MED ORDER — DIAZEPAM 5 MG PO TABS: 5.0000 mg | ORAL_TABLET | Freq: Every day | ORAL | 5 refills | Status: DC | PRN

## 2022-08-23 MED ORDER — OMEPRAZOLE 20 MG PO CPDR: 20.0000 mg | DELAYED_RELEASE_CAPSULE | Freq: Two times a day (BID) | ORAL | 3 refills | Status: AC

## 2022-08-23 MED ORDER — TRILEPTAL 300 MG PO TABS: ORAL_TABLET | ORAL | 5 refills | Status: DC

## 2022-08-23 NOTE — Progress Notes (Signed)
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 He is taking and tolerating Trileptal and has remained seizure free since June 2000. Christian Preston has ongoing problems mood, low frustration tolerance, and self directed aggression. Lexapro has helped with his mood and he has Diazepam for bouts of agitation   Today's concerns: No seizures since last visit Behavior unchanged but usually able to be managed by family and caregivers. Has problems with anxiety in public places but does fairly well in a wheelchair. Still working on home modifications Has received a power chair to take him out of the home and looking in how to transport the chair in their vehicle. Christian Preston has been otherwise generally healthy since he was last seen. No health concerns today other than previously mentioned.  Review of systems: Please see HPI for neurologic and other pertinent review of systems. Otherwise all other systems were reviewed and were negative.  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   Family history: family history includes Diabetes in his paternal grandmother; Heart Problems in his paternal grandmother.   Social history: Social History   Socioeconomic History   Marital status: Single    Spouse name: Not on file   Number of children: Not on file   Years of education: Not on file   Highest education level: Not on file  Occupational History   Not on file  Tobacco Use   Smoking status: Never   Smokeless tobacco: Never  Vaping Use   Vaping Use: Never used  Substance and Sexual Activity   Alcohol use: No   Drug use: No   Sexual activity: Never  Other Topics Concern   Not on file  Social History Narrative   Christian Preston is part of [employer] program but is not currently attending due to their mask requirements. He enjoys movies and music. He lives with his parents.    Social Determinants of Health   Financial Resource Strain: Not on file  Food Insecurity: Not on file  Transportation Needs: Not on file  Physical Activity: Not on file  Stress: Not on file  Social Connections: Not on file  Intimate Partner Violence: Not on file    Past/failed meds: Copied from previous record: Diazepam suspension - did not like the taste Depakote - toxicity and worsening seizures Fluoxetine -  increased OCD behaviors Carbamazepine - changes in behavior   Allergies: Allergies  Allergen Reactions   Depakote [Divalproex Sodium] Other (See Comments)    Toxicity and worsening seizures - occurred in childhood - prior to starting Trileptal in 2001   Prozac [Fluoxetine Hcl] Other (See Comments)    Increase in obsessive compulsive behavior    Carbamazepine And Analogs     Other reaction(s): Unknown   Chicken Allergy     Other reaction(s): Unknown   Eggs Or Egg-Derived Products     Other reaction(s): Unknown    Immunizations: Immunization History  Administered Date(s) Administered   Influenza Split 06/22/2014   Influenza, Seasonal, Injecte, Preservative Fre 05/11/2013   Influenza,inj,Quad PF,6+ Mos 06/18/2017, 09/03/2018, 05/13/2019, 08/02/2020   Influenza-Unspecified 07/21/2015, 06/11/2016   PFIZER(Purple Top)SARS-COV-2 Vaccination 11/19/2019, 12/11/2019    Diagnostics/Screenings: Copied from previous record: MRI Brain wo contrast- 09-10-2001 [facility] - Subtle confluent area of abnormal increased T2 signal in the left periventricular white matter adjacent to the atrium and occipital horn of the left lateral ventricle. Fairly normal myelination pattern seen in the motor strips bilaterally. The remainder of the myelination pattern, the frontal and parietal lobes is markedly delayed for patient's age. The myelination pattern correlates to a  patient of less than 1 year of age. Small right maxillary sinus mucous retention cyst. Mild bilateral cerebral atrophy more than anticipated for patient's age. No distinct intracranial lesions. The medial temporal lobes and hippocampi are otherwise normal. The CSF pathways and ventricles are normal. The remainder of the brain parenchyma is normal. The calvarium is normal. The orbital structures are normal.  The intracranial  flow voids are normal. No areas of restricted diffusion on the DWI sequence to suggest an acute infarct.  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    Physical Exam: BP 110/64 (BP Location: Left Arm, Patient Position: Sitting, Cuff Size: Normal)   Ht 5' 8.9" (1.75 m)   Wt 214 lb 3.2 oz (97.2 kg)   BMI 31.73 kg/m   General: well developed, well nourished man, pacing in the exam room, in  no evident distress Head: normocephalic and atraumatic. Oropharynx examination limited by lack of cooperation but appears benign. No dysmorphic features. Neck: supple Cardiovascular: regular rate and rhythm, no murmurs. Respiratory: clear to auscultation bilaterally Abdomen: bowel sounds present all four quadrants, abdomen soft, non-tender, non-distended. Musculoskeletal: no skeletal deformities or obvious scoliosis.  Skin: no rashes or neurocutaneous lesions  Neurologic Exam Mental Status: awake and fully alert. Has no language. Anxious and pacing in the exam room. Can be briefly soothed with a toy but then returns to wringing his hands and pacing. Resistant to invasions into his space Cranial Nerves: fundoscopic exam - red reflex present.  Unable to fully visualize fundus.  Pupils equal briskly reactive to light.  Turns to localize faces and objects in the periphery. Turns to localize sounds in the periphery. Facial movements are symmetric..  Motor: normal functional bulk, tone and strength Sensory: withdrawal x 4 Coordination: unable to adequately assess due to patient's inability to participate in examination. No dysmetria when reaching for objects. 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 No recent diagnostic studies to be reviewed with the patient's mother.  Plan until next visit: Continue medications as prescribed  Discussed options for transporting power wheelchair Call seizures occur or if there are other concerns. Return in about 1 year (around 08/24/2023).  The medication list was reviewed and reconciled. No changes were made in the prescribed medications today. A complete medication list was provided to the patient.  Allergies as of 08/23/2022       Reactions   Depakote [divalproex Sodium] Other (See Comments)   Toxicity and worsening seizures - occurred in childhood - prior to starting Trileptal in 2001   Prozac [fluoxetine Hcl] Other (See Comments)   Increase in obsessive compulsive behavior   Carbamazepine And Analogs    Other reaction(s): Unknown   Chicken Allergy    Other reaction(s): Unknown   Eggs Or Egg-derived Products    Other reaction(s): Unknown        Medication List        Accurate as of August 23, 2022 11:59 PM. If you have any questions, ask your nurse or doctor.          diazepam 5 MG tablet Commonly known as: VALIUM Take 1 tablet (5 mg total) by mouth daily as needed. for anxiety   escitalopram 10 MG tablet Commonly known as: LEXAPRO Take 1 tablet (10 mg total) by mouth daily.   omeprazole 20 MG capsule Commonly known as: PRILOSEC Take 1 capsule (20 mg total) by mouth 2 (two) times daily before a meal.   Trileptal 300 MG tablet Generic drug: Oxcarbazepine TAKE 1 AND 1/2 TABLETS BY MOUTH 2 TIMES A DAY      Total time spent with the patient was 20 minutes, of which 50% or more was spent in counseling and coordination of care.  Rockwell Germany NP-C [facility] [address] [place], [place] [address] Ph. [phone] Fax [phone]

## 2022-08-23 NOTE — Patient Instructions (Signed)
It was a pleasure to see you today!  Instructions for you until your next appointment are as follows: Continue [name]'s medications as prescribed Let me know if you need anything to get wheelchair attachment for Lucianne Lei Please sign up for MyChart if you have not done so. Please plan to return for follow up in one year or sooner if needed.  Feel free to contact our office during normal business hours at [phone] with questions or concerns. If there is no answer or the call is outside business hours, please leave a message and our clinic staff will call you back within the next business day.  If you have an urgent concern, please stay on the line for our after-hours answering service and ask for the on-call neurologist.     I also encourage you to use MyChart to communicate with me more directly. If you have not yet signed up for MyChart within [facility], the front desk staff can help you. However, please note that this inbox is NOT monitored on nights or weekends, and response can take up to 2 business days.  Urgent matters should be discussed with the on-call pediatric neurologist.   At [facility], we are committed to providing exceptional care. You will receive a patient satisfaction survey through text or email regarding your visit today. Your opinion is important to me. Comments are appreciated.

## 2022-08-25 ENCOUNTER — Encounter (INDEPENDENT_AMBULATORY_CARE_PROVIDER_SITE_OTHER): Payer: Self-pay | Admitting: Family

## 2022-08-25 DIAGNOSIS — Z9189 Other specified personal risk factors, not elsewhere classified: Secondary | ICD-10-CM | POA: Insufficient documentation

## 2022-08-25 DIAGNOSIS — N3942 Incontinence without sensory awareness: Secondary | ICD-10-CM | POA: Insufficient documentation

## 2022-09-06 ENCOUNTER — Other Ambulatory Visit: Payer: Self-pay | Admitting: Family

## 2022-09-06 DIAGNOSIS — G40209 Localization-related (focal) (partial) symptomatic epilepsy and epileptic syndromes with complex partial seizures, not intractable, without status epilepticus: Secondary | ICD-10-CM

## 2022-09-06 DIAGNOSIS — G40309 Generalized idiopathic epilepsy and epileptic syndromes, not intractable, without status epilepticus: Secondary | ICD-10-CM

## 2023-02-25 ENCOUNTER — Other Ambulatory Visit: Payer: Self-pay | Admitting: Family

## 2023-02-25 DIAGNOSIS — G40309 Generalized idiopathic epilepsy and epileptic syndromes, not intractable, without status epilepticus: Secondary | ICD-10-CM

## 2023-02-25 DIAGNOSIS — G40209 Localization-related (focal) (partial) symptomatic epilepsy and epileptic syndromes with complex partial seizures, not intractable, without status epilepticus: Secondary | ICD-10-CM

## 2023-05-08 ENCOUNTER — Other Ambulatory Visit: Payer: Self-pay | Admitting: Family

## 2023-05-08 DIAGNOSIS — G40209 Localization-related (focal) (partial) symptomatic epilepsy and epileptic syndromes with complex partial seizures, not intractable, without status epilepticus: Secondary | ICD-10-CM

## 2023-05-08 DIAGNOSIS — G40309 Generalized idiopathic epilepsy and epileptic syndromes, not intractable, without status epilepticus: Secondary | ICD-10-CM

## 2023-06-17 ENCOUNTER — Telehealth (INDEPENDENT_AMBULATORY_CARE_PROVIDER_SITE_OTHER): Payer: Self-pay | Admitting: Family

## 2023-06-17 NOTE — Telephone Encounter (Signed)
I called and spoke to Mom. She said that she sent documents to me but I have not received them. She will send again or will bring to me. They are working on getting bathroom modifications for [employer]. TG

## 2023-06-17 NOTE — Telephone Encounter (Signed)
  Name of who is calling: Princella Ion Relationship to Patient: mom   Best contact number: [phone]  Provider they see: Inetta Fermo   Reason for call: mom called stating that she has sent over forms from [employer] over to Tina's email about bathroom modification. She would like a call back regarding this.      PRESCRIPTION REFILL ONLY  Name of prescription:  Pharmacy:

## 2023-06-19 ENCOUNTER — Other Ambulatory Visit (INDEPENDENT_AMBULATORY_CARE_PROVIDER_SITE_OTHER): Payer: Self-pay | Admitting: Family

## 2023-06-19 ENCOUNTER — Telehealth (INDEPENDENT_AMBULATORY_CARE_PROVIDER_SITE_OTHER): Payer: Self-pay | Admitting: Family

## 2023-06-19 DIAGNOSIS — F840 Autistic disorder: Secondary | ICD-10-CM

## 2023-06-19 DIAGNOSIS — N3942 Incontinence without sensory awareness: Secondary | ICD-10-CM

## 2023-06-19 DIAGNOSIS — G40309 Generalized idiopathic epilepsy and epileptic syndromes, not intractable, without status epilepticus: Secondary | ICD-10-CM

## 2023-06-19 DIAGNOSIS — Q999 Chromosomal abnormality, unspecified: Secondary | ICD-10-CM

## 2023-06-19 NOTE — Telephone Encounter (Signed)
This was sent to his case manager at 1:36PM. TG

## 2023-06-19 NOTE — Telephone Encounter (Signed)
  Name of who is calling: Princella Ion Relationship to Patient: Mom  Best contact number: [phone]  Provider they see: Elveria Rising   Reason for call: Mom called and stated that she needs a prescription sent to [facility]. She also states the prescription should say "The bathroom modification safe access the bathroom shower for bathing and personal hygiene while litigating fall risk for the member"      PRESCRIPTION REFILL ONLY  Name of prescription:   Pharmacy:

## 2023-06-19 NOTE — Telephone Encounter (Signed)
Contacted patients mother. Verified patients name and DOB as well as mothers name.  Mom stated that Ms, Christian Preston knew what this call was referring to and that she needed the prescription to be written for home modifications.   Informed mom that I would let Mrs. Christian Preston know.   Mom verbalized understanding.   SS, CCMA

## 2023-06-28 ENCOUNTER — Telehealth (INDEPENDENT_AMBULATORY_CARE_PROVIDER_SITE_OTHER): Payer: Self-pay | Admitting: Family

## 2023-06-28 DIAGNOSIS — Q666 Other congenital valgus deformities of feet: Secondary | ICD-10-CM

## 2023-06-28 NOTE — Telephone Encounter (Signed)
  Name of who is calling: Jason Fila  Caller's Relationship to Patient: Mom  Best contact number: [phone]  Provider they see: Elveria Rising   Reason for call: Mom said pt wears orthotics for his feet and she said they are falling apart and he is needing a new prescription for new ones.   [facility] [phone] Fax     PRESCRIPTION REFILL ONLY  Name of prescription:  Pharmacy:

## 2023-07-01 ENCOUNTER — Encounter (INDEPENDENT_AMBULATORY_CARE_PROVIDER_SITE_OTHER): Payer: Self-pay

## 2023-07-01 NOTE — Telephone Encounter (Signed)
Referral sent   SS, CCMA

## 2023-07-01 NOTE — Addendum Note (Signed)
Addended by: Princella Ion on: 07/01/2023 02:25 PM   Modules accepted: Orders

## 2023-07-01 NOTE — Telephone Encounter (Signed)
Order has been written. Please sent to [facility] as requested. TG

## 2023-07-11 ENCOUNTER — Telehealth (INDEPENDENT_AMBULATORY_CARE_PROVIDER_SITE_OTHER): Payer: Self-pay | Admitting: Family

## 2023-07-11 NOTE — Telephone Encounter (Signed)
Who's calling (name and relationship to patient) : Christian Preston, mom  Best contact number: [phone]  Provider they see: Elveria Rising, NP  Reason for call: Mom called in wanting to confirm if order was sent to [facility] regarding braces.    Call ID:      PRESCRIPTION REFILL ONLY  Name of prescription:  Pharmacy:

## 2023-07-11 NOTE — Telephone Encounter (Signed)
Contacted [facility] to see if they received referral sent 11.11.2024. Representative stated that they had and will reach out to family to schedule eval.  Called mom to inform her of this. Mother verbalized understanding.  SS, CCMA

## 2023-07-22 ENCOUNTER — Telehealth (INDEPENDENT_AMBULATORY_CARE_PROVIDER_SITE_OTHER): Payer: Self-pay | Admitting: Family

## 2023-07-22 NOTE — Telephone Encounter (Signed)
Mom came into office to schedule appt. AFO's are getting older in order for him to get new ones he needs a appt scheduled. Appt is schedule for 12/16 @9 :30. She states that it takes 6 weeks for them to send and receive new once. Mom is wanting to be seen sooner than 12/16.

## 2023-07-23 NOTE — Telephone Encounter (Signed)
Mom accepted sooner appointment of 07/30/2023 @ 9AM. TG

## 2023-07-29 NOTE — Progress Notes (Unsigned)
This is a [facility] E-Visit consult/follow up provided via My Chart Video Visit (Caregility). Christian Preston and his mother Christian Preston consented to an E-Visit consult today.  Is the patient present for the video visit? Yes Location of patient: Christian Preston is at home Is the patient located in the state of [place]? Yes Location of provider: Elveria Rising, NP-C is at office Patient was referred by Barbie Banner, MD   The following participants were involved in this E-Visit: CMA, NP, patient's mother   This visit was done via VIDEO   Chief Complain/ Reason for E-Visit today: seizure follow up, needs new AFO's Total time on call: 15 minutes Follow up: 1 year   Christian Preston   MRN:  [id number]  Feb 01, 1996   Provider: Elveria Rising NP-C Location of Care: [facility]  Visit type: Return visit  Last visit: 08/23/2022  Referral source: Barbie Banner, MD History from: Epic chart and patient's mother  Brief history:  Copied from previous record: History of complex partial seizures with secondary generalization, autism, 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 He is taking and tolerating Trileptal and has remained seizure free since June 2000. Christian Preston has ongoing problems mood, low frustration tolerance, and self directed aggression. Lexapro has helped with his mood and he has Diazepam for bouts of agitation   Today's concerns: Mom reports that Christian Preston has remained seizure free since his last visit.  He continues to have episodes of agitation and easy frustration that requires redirection and sometimes Diazepam to help to calm his behavior.  He also has periods of time in which he sleeps for a few hours and then awakens. Parents have given him Diazepam at bedtime which has helped.  Parents have been  working on his intake and helping him to be more active. Mom believes that he has lost some weight over the last year.  Mom reports that some home modifications have been done, but that Christian Preston is still waiting on bathroom modification. Fencing has been provided to the patio and property for his safety and concern for elopement. He still needs bathroom modification as it is unsafe for him and his caregiver due to risk of falls and injury when performing hygiene measures.  Christian Preston has SMO's but the ones he has are not fitting properly and not improving his gait. He has low tone in his lower legs and feet, and hindfoot valgus when walking.  Christian Preston has been otherwise generally healthy since he was last seen. No health concerns today other than previously mentioned.  Review of systems: Please see HPI for neurologic and other pertinent review of systems. Otherwise all other systems were reviewed and were negative.  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   Family history: family history includes Diabetes in his paternal grandmother; Heart Problems in his paternal grandmother.    Social history: Social History   Socioeconomic History   Marital status: Single    Spouse name: Not on file   Number of children: Not on file   Years of education: Not on file   Highest education level: Not on file  Occupational History   Not on file  Tobacco Use   Smoking status: Never   Smokeless tobacco: Never  Vaping Use   Vaping status: Never Used  Substance and Sexual Activity   Alcohol use: No   Drug use: No   Sexual activity: Never  Other Topics Concern   Not on file  Social History Narrative   Christian Preston is part of [employer] off and on.   He enjoys movies and music. He lives with his parents.    Social Determinants of Health   Financial Resource Strain: Not on file  Food Insecurity: Not on file  Transportation Needs: Not on file  Physical Activity: Not on file  Stress: Not on file  Social Connections: Not on file  Intimate Partner Violence: Not on file    Past/failed meds: Copied from previous record: Diazepam suspension - did not like the taste Depakote - toxicity and worsening seizures Fluoxetine - increased OCD behaviors Carbamazepine - changes in behavior  Allergies: Allergies  Allergen Reactions   Depakote [Divalproex Sodium] Other (See Comments)    Toxicity and worsening seizures - occurred in childhood - prior to starting Trileptal in 2001   Prozac [Fluoxetine Hcl] Other (See Comments)    Increase in obsessive compulsive behavior   Carbamazepine And Analogs     Other reaction(s): Unknown   Chicken Allergy     Other reaction(s): Unknown   Egg-Derived Products     Other reaction(s): Unknown    Immunizations: Immunization History  Administered Date(s) Administered   Influenza Split 06/22/2014   Influenza, Seasonal, Injecte, Preservative Fre 05/11/2013   Influenza,inj,Quad PF,6+ Mos 06/18/2017, 09/03/2018, 05/13/2019, 08/02/2020   Influenza-Unspecified 07/21/2015, 06/11/2016   PFIZER(Purple Top)SARS-COV-2 Vaccination  11/19/2019, 12/11/2019   Diagnostics/Screenings: Copied from previous record: MRI Brain wo contrast- 09-10-2001 [facility] - Subtle confluent area of abnormal increased T2 signal in the left periventricular white matter adjacent to the atrium and occipital horn of the left lateral ventricle. Fairly normal myelination pattern seen in the motor strips bilaterally. The remainder of the myelination pattern, the frontal and parietal lobes is markedly delayed for patient's age. The myelination pattern correlates to a  patient of less than 1 year of age. Small right maxillary sinus mucous retention cyst. Mild bilateral cerebral atrophy more than anticipated for patient's age. No distinct intracranial lesions. The medial temporal lobes and hippocampi are otherwise normal. The CSF pathways and ventricles are normal. The remainder of the brain parenchyma is normal. The calvarium is normal. The orbital structures are normal.  The intracranial  flow voids are normal. No areas of restricted diffusion on the DWI sequence to suggest an acute infarct.  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   Physical Exam: There were no vitals taken for this visit.  Examination was limited by video format. General: well developed, well nourished, seated at his home, in  no evident distress Head: normocephalic and atraumatic. No dysmorphic features. Neck: supple Musculoskeletal: No skeletal deformities or obvious scoliosis. Has low tone in the lower legs and feet Skin: no rashes or neurocutaneous lesions  Neurologic Exam Mental Status: Awake and fully alert.  Has no language. Unable to follow commands or participate in examination Cranial Nerves: Turns to localize faces, objects and sounds in the periphery. Face, tongue, palate move normally and symmetrically. Motor: Normal functional bulk, tone and strength except for lower legs and feet  with have low tone Sensory: Withdrawal x 4.  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 No recent diagnostic studies to be reviewed with the patient. Christian Preston is doing fairly well overall. He has required increased dose of Diazepam in the evenings for restless behavior. He needs bathroom modification for safety for him and his caregiver when performing hygiene measures.   Christian Preston has low tone in his lower legs and feet, and hindfoot valgus when walking. He has SMO's but they are no longer appropriate for him as they are not controlling the valgus during ambulation. He would benefit from new bilateral custom AFO's, which would provide more support to hold him in a neutral position. This would prevent further deformity and improve function when bearing weight and walking. He requires custom AFO's as he will require the support for more than 6 months.   Plan until next visit: Continue medications as prescribed.  AFO's ordered Needs bathroom modifications Continue working on [name]'s intake and helping him to be more active Call for questions or concerns Return in about 1 year (around 07/29/2024).  The  medication list was reviewed and reconciled. No changes were made in the prescribed medications today. A complete medication list was provided to the patient.  Orders Placed This Encounter  Procedures   Ambulatory Referral for DME    Referral Priority:   Routine    Referral Type:   Durable Medical Equipment Purchase    Number of Visits Requested:   1   Allergies as of 07/30/2023       Reactions   Depakote [divalproex Sodium] Other (See Comments)   Toxicity and worsening seizures - occurred in childhood - prior to starting Trileptal in 2001   Prozac [fluoxetine Hcl] Other (See Comments)   Increase in obsessive compulsive behavior   Carbamazepine And Analogs    Other reaction(s): Unknown   Chicken Allergy    Other reaction(s): Unknown   Egg-derived Products    Other reaction(s): Unknown        Medication List        Accurate as of July 30, 2023 10:16 AM. If you have any questions, ask your nurse or doctor.          diazepam 5 MG tablet Commonly known as:  VALIUM Take 1/2 tablet during the day for anxiety and take 1 tablet at bedtime for sleep What changed:  how much to take how to take this when to take this reasons to take this additional instructions Changed by: Elveria Rising   escitalopram 10 MG tablet Commonly known as: LEXAPRO Take 1 tablet (10 mg total) by mouth daily.   omeprazole 20 MG capsule Commonly known as: PRILOSEC Take 1 capsule (20 mg total) by mouth 2 (two) times daily before a meal.   Trileptal 300 MG tablet Generic drug: Oxcarbazepine TAKE 1 AND 1/2 TABLET BY MOUTH TWICE A DAY      Total time spent with the patient was 15 minutes, of which 50% or more was spent in counseling and coordination of care.  Elveria Rising NP-C [facility] [address], [address] [place], [place] [address] Ph. [phone] Fax [phone]

## 2023-07-30 ENCOUNTER — Telehealth (INDEPENDENT_AMBULATORY_CARE_PROVIDER_SITE_OTHER): Payer: 59 | Admitting: Family

## 2023-07-30 ENCOUNTER — Encounter (INDEPENDENT_AMBULATORY_CARE_PROVIDER_SITE_OTHER): Payer: Self-pay | Admitting: Family

## 2023-07-30 DIAGNOSIS — R269 Unspecified abnormalities of gait and mobility: Secondary | ICD-10-CM

## 2023-07-30 DIAGNOSIS — R451 Restlessness and agitation: Secondary | ICD-10-CM

## 2023-07-30 DIAGNOSIS — Q999 Chromosomal abnormality, unspecified: Secondary | ICD-10-CM

## 2023-07-30 DIAGNOSIS — G40309 Generalized idiopathic epilepsy and epileptic syndromes, not intractable, without status epilepticus: Secondary | ICD-10-CM | POA: Diagnosis not present

## 2023-07-30 DIAGNOSIS — F840 Autistic disorder: Secondary | ICD-10-CM

## 2023-07-30 DIAGNOSIS — G40209 Localization-related (focal) (partial) symptomatic epilepsy and epileptic syndromes with complex partial seizures, not intractable, without status epilepticus: Secondary | ICD-10-CM

## 2023-07-30 DIAGNOSIS — N3942 Incontinence without sensory awareness: Secondary | ICD-10-CM

## 2023-07-30 DIAGNOSIS — R4681 Obsessive-compulsive behavior: Secondary | ICD-10-CM

## 2023-07-30 DIAGNOSIS — R29898 Other symptoms and signs involving the musculoskeletal system: Secondary | ICD-10-CM | POA: Insufficient documentation

## 2023-07-30 MED ORDER — ESCITALOPRAM OXALATE 10 MG PO TABS: 10.0000 mg | ORAL_TABLET | Freq: Every day | ORAL | 3 refills | Status: DC

## 2023-07-30 MED ORDER — DIAZEPAM 5 MG PO TABS: ORAL_TABLET | ORAL | 5 refills | Status: DC

## 2023-07-30 MED ORDER — TRILEPTAL 300 MG PO TABS: ORAL_TABLET | ORAL | 5 refills | Status: DC

## 2023-07-30 NOTE — Patient Instructions (Addendum)
It was a pleasure to see you today!  Instructions for you until your next appointment are as follows: Continue medications as prescribed. The Diazepam prescription has been updated for him to receive a dose during the day and a dose at night AFO's ordered & form sent to [facility] Let me know if you need anything for the bathroom modifications Continue working on [name]'s intake and helping him to be more active Call for questions or concerns Please sign up for MyChart if you have not done so. Please plan to return for follow up in 1 year or sooner if needed.  Feel free to contact our office during normal business hours at [phone] with questions or concerns. If there is no answer or the call is outside business hours, please leave a message and our clinic staff will call you back within the next business day.  If you have an urgent concern, please stay on the line for our after-hours answering service and ask for the on-call neurologist.     I also encourage you to use MyChart to communicate with me more directly. If you have not yet signed up for MyChart within [facility], the front desk staff can help you. However, please note that this inbox is NOT monitored on nights or weekends, and response can take up to 2 business days.  Urgent matters should be discussed with the on-call pediatric neurologist.   At [facility], we are committed to providing exceptional care. You will receive a patient satisfaction survey through text or email regarding your visit today. Your opinion is important to me. Comments are appreciated.

## 2023-08-05 ENCOUNTER — Telehealth (INDEPENDENT_AMBULATORY_CARE_PROVIDER_SITE_OTHER): Payer: 59 | Admitting: Family

## 2023-08-20 ENCOUNTER — Telehealth (INDEPENDENT_AMBULATORY_CARE_PROVIDER_SITE_OTHER): Payer: Self-pay | Admitting: Family

## 2023-08-20 NOTE — Telephone Encounter (Signed)
  Name of who is calling: Clarita Millard Relationship to Patient: mom   Best contact number: [phone]  Provider they see: Ellouise   Reason for call: mom called regarding a letter Ellouise wrote to [employer] about handicap shower. She states that they wouldn't approve it off the word [employer] it would have to be a medical necessity. She says since that wasn't  mentioned she would like to know if it can be added. She would like a call back regarding this.     PRESCRIPTION REFILL ONLY  Name of prescription:  Pharmacy:

## 2023-08-22 NOTE — Telephone Encounter (Signed)
 I called Mom to clarify what was needed. She is still working on Warden/ranger for [employer]. I will write an updated letter and send to his case manager, Barbaraann Faster with [facility]. TG

## 2023-08-26 NOTE — Telephone Encounter (Signed)
 Mom called wanting to know if the updated letter could be sent to her as well at [email or web address]. She also says Inetta Fermo may need to send a prescription for necessities. She would like a call back to confirm this, [phone].

## 2023-08-26 NOTE — Telephone Encounter (Signed)
 I emailed a copy of the letter as requested. TG

## 2023-08-28 ENCOUNTER — Other Ambulatory Visit (INDEPENDENT_AMBULATORY_CARE_PROVIDER_SITE_OTHER): Payer: Self-pay | Admitting: Family

## 2023-08-28 DIAGNOSIS — R29898 Other symptoms and signs involving the musculoskeletal system: Secondary | ICD-10-CM

## 2023-08-28 DIAGNOSIS — F840 Autistic disorder: Secondary | ICD-10-CM

## 2023-08-28 DIAGNOSIS — Q999 Chromosomal abnormality, unspecified: Secondary | ICD-10-CM

## 2023-08-28 DIAGNOSIS — R269 Unspecified abnormalities of gait and mobility: Secondary | ICD-10-CM

## 2023-08-28 DIAGNOSIS — N3942 Incontinence without sensory awareness: Secondary | ICD-10-CM

## 2023-09-12 ENCOUNTER — Ambulatory Visit (INDEPENDENT_AMBULATORY_CARE_PROVIDER_SITE_OTHER): Payer: 59 | Admitting: Family

## 2023-10-25 ENCOUNTER — Telehealth (INDEPENDENT_AMBULATORY_CARE_PROVIDER_SITE_OTHER): Payer: Self-pay | Admitting: Family

## 2023-10-25 NOTE — Telephone Encounter (Signed)
  Name of who is calling: janet   Caller's Relationship to Patient: mother  Best contact number: [phone]  Provider they see: [employer]   Reason for call: copy of letter about bathroom modifications was provided from [employer] she said she was trying to print it off and says that it expired? She said could send it via email please, she is needing it for Tuesday   [email or web address]      PRESCRIPTION REFILL ONLY  Name of prescription:  Pharmacy:

## 2023-10-28 NOTE — Telephone Encounter (Signed)
 I emailed the letter to Mom. TG

## 2023-10-29 ENCOUNTER — Telehealth (INDEPENDENT_AMBULATORY_CARE_PROVIDER_SITE_OTHER): Payer: Self-pay | Admitting: Family

## 2023-10-29 NOTE — Telephone Encounter (Signed)
 I called in and joined the conference call for approximately 10 minutes. TG

## 2023-10-29 NOTE — Telephone Encounter (Signed)
 Contacted patients mother.  Verified patients name and DOB as well as mothers name.   Informed patients mother of Mrs. Tina's schedule for today. Mom stated that the call will not take long. Mom stated that she will let Mrs. Inetta Fermo speak first & then she may end the call.   [phone]  Code: [phone]  SS, CCMA

## 2023-10-29 NOTE — Telephone Encounter (Signed)
 Mom called regarding bathroom modification appointment. Says she there was appointment to meet at 3:30pm today and she ended up taking it. She stated that she will need Tina's help during this call. Mom is wondering if Inetta Fermo is able to help on short notice. Would like a call back [phone].

## 2023-11-20 ENCOUNTER — Telehealth (INDEPENDENT_AMBULATORY_CARE_PROVIDER_SITE_OTHER): Payer: Self-pay | Admitting: Family

## 2023-11-20 NOTE — Telephone Encounter (Signed)
 Skin condition is molluscum / contagiosum and mitigate foot pain

## 2023-11-20 NOTE — Telephone Encounter (Signed)
  Name of who is calling: janet   Caller's Relationship to Patient: mother   Best contact number: [phone]   Provider they see: [employer]   Reason for call: Bathroom modifications letter, she needs a new letter just adding that it needs to have bathroom tub is a medical necessary due to skin condition and foot pain.  Has to have it in writing not. Email it to [email or web address]     PRESCRIPTION REFILL ONLY  Name of prescription:  Pharmacy:

## 2023-11-20 NOTE — Telephone Encounter (Signed)
 I called Mom and discussed the updates to the letter needed for bathroom modification. I will write the letter and email it to her as requested. TG

## 2023-11-21 NOTE — Telephone Encounter (Signed)
 The letter sent does note that the bathroom modification is medically necessary.I confirmed with Mom that it was received. TG

## 2023-11-21 NOTE — Telephone Encounter (Signed)
 Mom(janet) called in stating that she did received the letter. She stated that she emailed Inetta Fermo back to let Inetta Fermo know that the letter needs to state "its medically necessary". Marylu Lund stated she emailed it twice by accident, but email contains info that is needed regarding the letter. She also mentioned that he sometimes need 2 caregivers for bathing. Mom wanted to let Inetta Fermo know and to make sure that email was received.

## 2024-02-20 ENCOUNTER — Other Ambulatory Visit (INDEPENDENT_AMBULATORY_CARE_PROVIDER_SITE_OTHER): Payer: Self-pay | Admitting: Family

## 2024-02-20 DIAGNOSIS — R451 Restlessness and agitation: Secondary | ICD-10-CM

## 2024-04-22 ENCOUNTER — Telehealth (INDEPENDENT_AMBULATORY_CARE_PROVIDER_SITE_OTHER): Payer: Self-pay | Admitting: Family

## 2024-04-22 NOTE — Telephone Encounter (Signed)
 Who's calling (name and relationship to patient) : Rafiel Mecca, mom   Best contact number: [phone]  Provider they see: Marianna, NP   Reason for call: Mom called in wanting to speak with Ellouise. She stated that [employer] is having a hearing coming up, and she wanted to know if Ellouise would attend the phone conference (Next Wednesday at 12 pm) for home modifications. She stated that it would only take a few minutes. She is requesting a call back.    Call ID:      PRESCRIPTION REFILL ONLY  Name of prescription:  Pharmacy:

## 2024-04-22 NOTE — Telephone Encounter (Signed)
 I called and spoke with Mom. I will try to join the hearing on that day if possible.

## 2024-04-24 NOTE — Telephone Encounter (Signed)
 Mom called and stated that the test has been change to 1:30pm next Wednesday 04/29/24. Mom would like a callback at [phone].

## 2024-04-29 ENCOUNTER — Telehealth (INDEPENDENT_AMBULATORY_CARE_PROVIDER_SITE_OTHER): Payer: Self-pay | Admitting: Family

## 2024-04-29 NOTE — Telephone Encounter (Signed)
 ERROR

## 2024-04-29 NOTE — Telephone Encounter (Signed)
 Mom is calling in to speak with Provider. She would like a callback at [phone].

## 2024-04-29 NOTE — Telephone Encounter (Signed)
 I was not able to join the call today

## 2024-05-06 ENCOUNTER — Telehealth (INDEPENDENT_AMBULATORY_CARE_PROVIDER_SITE_OTHER): Payer: Self-pay | Admitting: Family

## 2024-05-06 NOTE — Telephone Encounter (Signed)
 Contacted patients mother.  Verified patients name and DOB as well as mothers name.   Mom reiterated what was communicated with front desk.   SS, CCMA

## 2024-05-06 NOTE — Telephone Encounter (Signed)
  Name of who is calling: Clarita Millard Relationship to Patient: Mom  Best contact number: [phone]  Provider they see: Ellouise Bollman   Reason for call: Mom is calling to speak with Ellouise Bollman regarding a letter that was written for bathroom modifications. She states his PCP is Bernardino Level NP would like this information faxed to Fax [phone].     PRESCRIPTION REFILL ONLY  Name of prescription:  Pharmacy:

## 2024-05-07 ENCOUNTER — Other Ambulatory Visit (INDEPENDENT_AMBULATORY_CARE_PROVIDER_SITE_OTHER): Payer: Self-pay | Admitting: Family

## 2024-05-07 DIAGNOSIS — G40309 Generalized idiopathic epilepsy and epileptic syndromes, not intractable, without status epilepticus: Secondary | ICD-10-CM

## 2024-05-07 DIAGNOSIS — G40209 Localization-related (focal) (partial) symptomatic epilepsy and epileptic syndromes with complex partial seizures, not intractable, without status epilepticus: Secondary | ICD-10-CM

## 2024-05-07 DIAGNOSIS — R4681 Obsessive-compulsive behavior: Secondary | ICD-10-CM

## 2024-05-07 DIAGNOSIS — R451 Restlessness and agitation: Secondary | ICD-10-CM

## 2024-05-20 ENCOUNTER — Ambulatory Visit: Attending: Nurse Practitioner | Admitting: Physical Therapy

## 2024-05-20 DIAGNOSIS — R2689 Other abnormalities of gait and mobility: Secondary | ICD-10-CM | POA: Diagnosis present

## 2024-05-20 NOTE — Therapy (Unsigned)
 OUTPATIENT PHYSICAL THERAPY NEURO EVALUATION   Patient Name: Christian Preston MRN: [id number] DOB:1996-06-21, 28 y.o., male Today's Date: 05/21/2024   PCP: Tanda Prentice DEL, MD REFERRING PROVIDER: Anita Bernardino BROCKS, FNP  END OF SESSION:  PT End of Session - 05/20/24 1348     Visit Number 1    Number of Visits 1    Authorization Type [employer]    PT Start Time 1348    PT Stop Time 1425    PT Time Calculation (min) 37 min    Activity Tolerance Patient tolerated treatment well    Behavior During Therapy Restless;Impulsive          Past Medical History:  Diagnosis Date   Autistic disorder, current or active state    Seizures (HCC)    No past surgical history on file. 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   Partial epilepsy with impairment of consciousness (HCC) 12/11/2013   Active autistic disorder 12/11/2013    ONSET DATE: Chronic  REFERRING DIAG: F84.0 (ICD-10-CM) - Autistic disorder G31.9 (ICD-10-CM) - Degenerative disease of nervous system, unspecified  THERAPY DIAG:  Other abnormalities of gait and mobility  Rationale for Evaluation and Treatment: Rehabilitation  SUBJECTIVE:                                                                                                                                                                                             SUBJECTIVE STATEMENT: Mother is present to provide subjective. Pt is nonverbal at baseline with history of autism disorder. 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 Family tries to keep pt as mobile as possible. Pt has constant supervision with a caregiver 3 days/wk and on 2 Saturdays per month and family available when caregivers are not present.   Mother states they changed houses and moved into a bigger home ~6 years ago so pt had more room to roam around  safely with less steps (Pt has issues with stairs but as pt has gotten older has been refusing/resisting negotiating stairs) and more railings in the back. Current home is flat with only 1 step on the front porch. Mother states half the house is linoleum and the other half is hardwood (pt has transition issues due to his perception deficits). Pt's R foot is slowly starting to turn out more and they have been recommended into taller braces. Does not walk barefoot in the home at all except during showers. Mother has noted increased behaviors at the end of the day with his feet and during prolonged standing/stairs.   Mother states the biggest issue has been pt's bathroom and overall hygiene care. Pt currently uses a converted shower in family's laundry room but current shower chair is smaller than it was to his previous home make it difficulty for caregivers to move [name]'s hips/legs to clean perineal area (especially if [name] is resisting them due to his discomfort with a smaller area to move his legs). Mother states every morning [name] wakes up very wet from soiling himself at night since the current diapers that his insurance covers has not been keeping him as protected. Mother notes pt has been having skin breakdown with difficulty keeping pt clean around perineal areas. She has also noted increased calluses along pt's  feet/ankles from constant SMO use and pt demonstrating pain behaviors with his feet. Has been recommended to her in the past to soak pt's feet in a tub to help with foot care and calluses; however, a normal tub is not safe for pt use.    Currently got new pair of AFOs for his feet and valgus deformities within the last 4-5 months. Mother notes there is issues with the shower -- pt requires grab bars and big enough for shower chair. Pt has caregiver 3 days/wk and 2 Saturdays from 7:30am to 3:30pm. Pt is used to having constant supervision. Mother states every morning [name] wakes up wet -- current diaper is not good enough to keep him protected. Has been compensating with modifying pt's sheets/mattress. Mother states pt has had a lot of skin breakdown because of this. Difficult to keep pt clean -- will resist especially when sitting in a smaller shower chair. Family tries to keep him as mobile as possible. Mother states they have been approved to get a bigger shower but needs a handicap accessible tub. Has been recommended by orthotists in the past to soak pt's feet in tub to help with foot care and calluses and would benefit from accessible tub.   Pt accompanied by: family member, mother  PERTINENT HISTORY: active autistic disorder, generalized convulsive epilepsy, cerebral atrophy, chromosomal anomaly, obsessive behavior, hypercholesterolemia, urinary incontinence without sensory awareness, and acid reflux  PAIN:  Are you having pain? Yes based on behavioral pain scale during 5x STS and stairs --  Facial expression: Partially tightened +2 Upper limb movement: Fully bent with finger flexion +3  PRECAUTIONS: {Therapy precautions:24002}  RED FLAGS: {PT Red Flags:29287}   WEIGHT BEARING RESTRICTIONS: {Yes ***/No:24003}  FALLS: Has patient fallen in last 6 months? {fallsyesno:27318}  LIVING ENVIRONMENT: Lives with: {OPRC lives with:25569::lives with their family} Lives in: {Lives  in:25570} Stairs: {opstairs:27293} Has following equipment at home: {Assistive devices:23999}  PLOF: {PLOF:24004}  PATIENT GOALS: ***  OBJECTIVE:  Note: Objective measures were completed at Evaluation unless otherwise noted.  DIAGNOSTIC FINDINGS: ***  COGNITION: Overall cognitive status: {cognition:24006}   SENSATION: {sensation:27233}  COORDINATION: ***  EDEMA:  {  edema:24020}  MUSCLE TONE: {LE tone:25568}  MUSCLE LENGTH: Hamstrings: Right *** deg; Left *** deg Debby test: Right *** deg; Left *** deg  DTRs:  {DTR SITE:24025}  POSTURE: {posture:25561}  LOWER EXTREMITY ROM:     {AROM/PROM:27142}  Right Eval Left Eval  Hip flexion    Hip extension    Hip abduction    Hip adduction    Hip internal rotation    Hip external rotation    Knee flexion    Knee extension    Ankle dorsiflexion    Ankle plantarflexion    Ankle inversion    Ankle eversion     (Blank rows = not tested)  LOWER EXTREMITY MMT:    MMT Right Eval Left Eval  Hip flexion    Hip extension    Hip abduction    Hip adduction    Hip internal rotation    Hip external rotation    Knee flexion    Knee extension    Ankle dorsiflexion    Ankle plantarflexion    Ankle inversion    Ankle eversion    (Blank rows = not tested)  BED MOBILITY:  {bed mobility:32615:p}  TRANSFERS: {transfers eval:32620}  RAMP:  {ramp eval:32616}  CURB:  {curb eval:32617}  STAIRS: {stairs eval:32618} GAIT: Findings: {GaitneuroPT:32644::Distance walked: ***,Comments: ***}  FUNCTIONAL TESTS:  5 times sit to stand: 54.75 sec with increased tactile cueing from mother  PATIENT SURVEYS:  {rehab surveys:24030}                                                                                                                              TREATMENT DATE: ***    PATIENT EDUCATION: Education details: *** Person educated: {Person educated:25204} Education method: {Education Method:25205} Education  comprehension: {Education Comprehension:25206}  HOME EXERCISE PROGRAM: ***  GOALS: Goals reviewed with patient? {yes/no:20286}  SHORT TERM GOALS: Target date: ***  *** Baseline: Goal status: INITIAL  2.  *** Baseline:  Goal status: INITIAL  3.  *** Baseline:  Goal status: INITIAL  4.  *** Baseline:  Goal status: INITIAL  5.  *** Baseline:  Goal status: INITIAL  6.  *** Baseline:  Goal status: INITIAL  LONG TERM GOALS: Target date: ***  *** Baseline:  Goal status: INITIAL  2.  *** Baseline:  Goal status: INITIAL  3.  *** Baseline:  Goal status: INITIAL  4.  *** Baseline:  Goal status: INITIAL  5.  *** Baseline:  Goal status: INITIAL  6.  *** Baseline:  Goal status: INITIAL  ASSESSMENT:  CLINICAL IMPRESSION: Patient is a 28 y.o. M who was seen today for physical therapy evaluation and recommendations for improved home modifications to decrease caregiver burden, reduce foot pain, and decrease risk of wounds/tissue break down.  . Pt resists allowing caregivers to clean between his legs and has less room to move his legs while sitting in the smaller shower chair. Additionally, pt does not tolerate prolonged standing and is  not safe to stand in the shower due to his congenital foot valgus deformities. Due to his deformities and chronic use of Pt has had increased ***   OBJECTIVE IMPAIRMENTS: {opptimpairments:25111}.   ACTIVITY LIMITATIONS: {activitylimitations:27494}  PARTICIPATION LIMITATIONS: {participationrestrictions:25113}  PERSONAL FACTORS: {Personal factors:25162} are also affecting patient's functional outcome.   REHAB POTENTIAL: {rehabpotential:25112}  CLINICAL DECISION MAKING: {clinical decision making:25114}  EVALUATION COMPLEXITY: {Evaluation complexity:25115}  PLAN:  PT FREQUENCY: {rehab frequency:25116}  PT DURATION: {rehab duration:25117}  PLANNED INTERVENTIONS: {rehab planned interventions:25118::97110-Therapeutic  exercises,97530- Therapeutic [phone]- Neuromuscular re-education,97535- Self Rjmz,02859- Manual therapy,Patient/Family education}  PLAN FOR NEXT SESSION: ***   Koa Palla April Ma L Tomika Eckles, PT, DPT 05/21/2024, 5:21 PM

## 2024-05-28 ENCOUNTER — Other Ambulatory Visit: Payer: Self-pay | Admitting: Family

## 2024-05-28 ENCOUNTER — Other Ambulatory Visit (INDEPENDENT_AMBULATORY_CARE_PROVIDER_SITE_OTHER): Payer: Self-pay | Admitting: Family

## 2024-05-28 DIAGNOSIS — R451 Restlessness and agitation: Secondary | ICD-10-CM

## 2024-05-28 MED ORDER — DIAZEPAM 5 MG PO TABS: ORAL_TABLET | ORAL | 2 refills | Status: DC

## 2024-05-28 NOTE — Telephone Encounter (Signed)
 Contacted patients mother.  Verified patients name and DOB as well as mothers name.   Informed mom of the reason for the denial.  Mom stated that they have moved and would like to the RX to go to the pharmacy in [place].   SS, CCMA

## 2024-05-28 NOTE — Telephone Encounter (Signed)
  Name of who is calling: Clarita Posey   Caller's Relationship to Patient: Mom  Best contact number: [phone]  Provider they see: Ellouise Bollman   Reason for call: Mom called in stating that her sons Rx refill (Valium ) was denied and wasn't sure why. She is requesting a call back.      PRESCRIPTION REFILL ONLY  Name of prescription: Valium    Ph[facility] [place], [place]

## 2024-06-18 ENCOUNTER — Other Ambulatory Visit (INDEPENDENT_AMBULATORY_CARE_PROVIDER_SITE_OTHER): Payer: Self-pay | Admitting: Family

## 2024-06-18 DIAGNOSIS — G40209 Localization-related (focal) (partial) symptomatic epilepsy and epileptic syndromes with complex partial seizures, not intractable, without status epilepticus: Secondary | ICD-10-CM

## 2024-06-18 DIAGNOSIS — G40309 Generalized idiopathic epilepsy and epileptic syndromes, not intractable, without status epilepticus: Secondary | ICD-10-CM

## 2024-07-21 ENCOUNTER — Telehealth (INDEPENDENT_AMBULATORY_CARE_PROVIDER_SITE_OTHER): Admitting: Family

## 2024-07-21 ENCOUNTER — Encounter (INDEPENDENT_AMBULATORY_CARE_PROVIDER_SITE_OTHER): Payer: Self-pay | Admitting: Family

## 2024-07-21 ENCOUNTER — Encounter (INDEPENDENT_AMBULATORY_CARE_PROVIDER_SITE_OTHER): Payer: Self-pay

## 2024-07-21 VITALS — Wt 210.0 lb

## 2024-07-21 DIAGNOSIS — F840 Autistic disorder: Secondary | ICD-10-CM | POA: Diagnosis not present

## 2024-07-21 DIAGNOSIS — G40309 Generalized idiopathic epilepsy and epileptic syndromes, not intractable, without status epilepticus: Secondary | ICD-10-CM

## 2024-07-21 DIAGNOSIS — G40209 Localization-related (focal) (partial) symptomatic epilepsy and epileptic syndromes with complex partial seizures, not intractable, without status epilepticus: Secondary | ICD-10-CM | POA: Diagnosis not present

## 2024-07-21 DIAGNOSIS — Z9189 Other specified personal risk factors, not elsewhere classified: Secondary | ICD-10-CM

## 2024-07-21 DIAGNOSIS — R451 Restlessness and agitation: Secondary | ICD-10-CM | POA: Diagnosis not present

## 2024-07-21 DIAGNOSIS — G40409 Other generalized epilepsy and epileptic syndromes, not intractable, without status epilepticus: Secondary | ICD-10-CM

## 2024-07-21 DIAGNOSIS — R4681 Obsessive-compulsive behavior: Secondary | ICD-10-CM

## 2024-07-21 DIAGNOSIS — R269 Unspecified abnormalities of gait and mobility: Secondary | ICD-10-CM

## 2024-07-21 DIAGNOSIS — Q999 Chromosomal abnormality, unspecified: Secondary | ICD-10-CM

## 2024-07-21 DIAGNOSIS — R29898 Other symptoms and signs involving the musculoskeletal system: Secondary | ICD-10-CM

## 2024-07-21 DIAGNOSIS — N3942 Incontinence without sensory awareness: Secondary | ICD-10-CM

## 2024-07-21 MED ORDER — TRILEPTAL 300 MG PO TABS: ORAL_TABLET | ORAL | 5 refills | Status: AC

## 2024-07-21 MED ORDER — ESCITALOPRAM OXALATE 10 MG PO TABS: 10.0000 mg | ORAL_TABLET | Freq: Every day | ORAL | 3 refills | Status: AC

## 2024-07-21 MED ORDER — DIAZEPAM 5 MG PO TABS: ORAL_TABLET | ORAL | 5 refills | Status: AC

## 2024-07-21 NOTE — Progress Notes (Signed)
 This is a Pediatric Specialist E-Visit consult/follow up provided via My Chart Video Visit (Careg[facility]). Christian Preston and his mother Christian Preston consented to an E-Visit consult today.  Is the patient present for the video visit? yes Location of patient: Christian Preston is at home  Is the patient located in the state of [place] ? yes Location of provider: Ellouise Bollman, NP-C is at office Patient was referred by Tanda Prentice DEL, MD   The following participants were involved in this E-Visit: CMA, NP, patient's mother   This visit was done via VIDEO   Chief Complain/ Reason for E-Visit today: follow up Total time on call: 30 minutes Follow up: 1 year   Christian Preston   MRN:  [id number]  1996/03/24   Provider: Ellouise Bollman NP-C Location of Care: [facility]  Visit type: Return visit  Last visit: 07/30/2023  Referral source: Tanda Prentice DEL, MD PCP: Tanda Prentice DEL, MD History from: Epic chart and patient's mother  Brief history:  Copied from previous record: History of complex partial seizures with secondary generalization, autism, 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 He is taking and tolerating Trileptal  and has remained seizure free since June 2000. Christian Preston has ongoing problems mood, low frustration tolerance, and self directed aggression. Lexapro  has helped with his mood and he has Diazepam  for bouts of agitation   Due to his medical condition, Christian Preston is indefinitely incontinent of stool and urine.  It is medically necessary for him to use diapers, underpads, and gloves to assist with hygiene and skin integrity.     Since last visit: Christian Preston has remained seizure free since his last visit He is more agitated with the colder weather because he is not allowed to go outside and do usual activities. Mom has  been giving him Diazepam  1/2 tablet but says that behavior is still a problem.  Bathroom modifications are being done. The bathtub was denied. Christian Preston needs a shower chair so that the family can safely bathe him in the new shower that is being built.  Mom wonders about a foot/lower leg bath device for him since the bathtub was denied. She feels that this would help when they need to soak his feet. Christian Preston has been otherwise generally healthy since he was last seen. No health concerns today other than previously mentioned.  Review of systems: Please see HPI for neurologic and other pertinent review of systems. Otherwise all other systems were reviewed and were negative.  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   Family history: family history includes Diabetes in his paternal grandmother; Heart Problems in his paternal grandmother.   Social history: Social History   Socioeconomic History   Marital status: Single    Spouse name: Not on file   Number of children: Not on file   Years of education: Not on file    Highest education level: Not on file  Occupational History   Not on file  Tobacco Use   Smoking status: Never   Smokeless tobacco: Never  Vaping Use   Vaping status: Never Used  Substance and Sexual Activity   Alcohol use: No   Drug use: No   Sexual activity: Never  Other Topics Concern   Not on file  Social History Narrative   Christian Preston is part of [employer] off and on.   He enjoys movies and music. He lives with his parents.    Social Drivers of Corporate Investment Banker Strain: Not on file  Food Insecurity: Low Risk  (05/06/2024)   Received from [facility]   Hunger Vital Sign    Within the past 12 months, you worried that your food would run out before you got money to buy more: Never true    Within the past 12 months, the food you bought just didn't last and you didn't have money to get more. : Never true  Transportation Needs: No Transportation Needs (05/06/2024)   Received from [facility]    In the past 12 months, has lack of reliable transportation kept you from medical appointments, meetings, work or from getting things needed for daily living? : No  Physical Activity: Not on file  Stress: Not on file  Social Connections: Not on file  Intimate Partner Violence: Not on file    Past/failed meds: Copied from previous record: Diazepam  suspension - did not like the taste Depakote - toxicity and worsening seizures Fluoxetine - increased OCD behaviors Carbamazepine - changes in behavior  Allergies: Allergies  Allergen Reactions   Depakote [Divalproex Sodium] Other (See Comments)    Toxicity and worsening seizures - occurred in childhood - prior to starting Trileptal  in 2001   Prozac [Fluoxetine Hcl] Other (See Comments)    Increase in obsessive compulsive behavior   Carbamazepine, Eslicarbazepine, And Oxcarbazepine      Other reaction(s): Unknown   Chicken Allergy     Other reaction(s): Unknown   Egg Protein-Containing Drug  Products     Other reaction(s): Unknown    Immunizations: Immunization History  Administered Date(s) Administered   Influenza Split 06/22/2014   Influenza, Seasonal, Injecte, Preservative Fre 05/11/2013   Influenza,inj,Quad PF,6+ Mos 06/18/2017, 09/03/2018, 05/13/2019, 08/02/2020   Influenza-Unspecified 07/21/2015, 06/11/2016   PFIZER(Purple Top)SARS-COV-2 Vaccination 11/19/2019, 12/11/2019    Diagnostics/Screenings: Copied from previous record: MRI Brain wo contrast- 09-10-2001 [facility] - Subtle confluent area of abnormal increased T2 signal in the left periventricular white matter adjacent to the atrium and occipital horn of the left lateral ventricle. Fairly normal myelination pattern seen in the motor strips bilaterally. The remainder of the myelination pattern, the frontal and parietal lobes is markedly delayed for patient's age. The myelination pattern correlates to a  patient of less than 1 year of age. Small right maxillary sinus mucous retention cyst. Mild bilateral cerebral atrophy more than anticipated for patient's age. No distinct intracranial lesions. The medial temporal lobes and hippocampi are otherwise normal. The CSF pathways and ventricles are normal. The remainder of the  brain parenchyma is normal. The calvarium is normal. The orbital structures are normal.  The intracranial  flow voids are normal. No areas of restricted diffusion on the DWI sequence to suggest an acute infarct.  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   Physical Exam: Wt 210 lb (95.3 kg)   BMI 31.10 kg/m   General: well developed, well nourished man, pacing in his home, in no evident distress Head: normocephalic and atraumatic. No dysmorphic features. Neck: supple Musculoskeletal: No skeletal deformities or obvious scoliosis Skin: no rashes or neurocutaneous lesions  Neurologic Exam Mental Status: Awake and  fully alert.  Attention span, concentration, and fund of knowledge appropriate for age.  Speech fluent without dysarthria.  Able to follow commands and participate in examination. Cranial Nerves: Turns to localize faces, objects and sounds in the periphery. Facial sensation intact.  Face, tongue, palate move normally and symmetrically. Motor: Normal functional bulk, tone and strength Sensory: Intact to touch and temperature in all extremities. Coordination: Finger-to-nose and heel-to-shin intact bilaterally. Balance adequate Gait and Station: Arises from chair, without difficulty. Stance is normal.  Gait demonstrates normal stride length and balance.   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 No recent diagnostic studies to be reviewed with the patient.   Recommendations and plan until next visit: Increase Diazepam  to 1/2 tablet twice per day and 1 tablet at night Continue other medications as prescribed  Referral for shower chair sent to [employer] Call for questions or concerns Return in  about 1 year (around 07/21/2025).  The medication list was reviewed and reconciled. I reviewed the changes that were made in the prescribed medications today. A complete medication list was provided to the patient.  Orders Placed This Encounter  Procedures   Ambulatory Referral for DME    Referral Priority:   Routine    Referral Type:   Durable Medical Equipment Purchase    Number of Visits Requested:   1   Allergies as of 07/21/2024       Reactions   Depakote [divalproex Sodium] Other (See Comments)   Toxicity and worsening seizures - occurred in childhood - prior to starting Trileptal  in 2001   Prozac [fluoxetine Hcl] Other (See Comments)   Increase in obsessive compulsive behavior   Carbamazepine, Eslicarbazepine, And Oxcarbazepine     Other reaction(s): Unknown   Chicken Allergy    Other reaction(s): Unknown   Egg Protein-containing Drug Products    Other reaction(s): Unknown        Medication List        Accurate as of July 21, 2024  8:28 PM. If you have  any questions, ask your nurse or doctor.          diazepam  5 MG tablet Commonly known as: VALIUM  Take 1/2 tablet in the morning and 1/2 tablet in the evening for anxiety. Also take 1 tablet at bedtime for sleep What changed: additional instructions Changed by: Ellouise Bollman   escitalopram  10 MG tablet Commonly known as: LEXAPRO  Take 1 tablet (10 mg total) by mouth daily.   omeprazole  20 MG capsule Commonly known as: PRILOSEC Take 1 capsule (20 mg total) by mouth 2 (two) times daily before a meal.   Trileptal  300 MG tablet Generic drug: Oxcarbazepine  TAKE 1 AND 1/2 TABLETS BY MOUTH TWICE A DAY      I spent 30 minutes caring for the patient today face to face reviewing records, including previous charts and test results, examination of the patient, discussion and education with his mother about his condition, documentation in his chart, developing a plan of care, ordering refills and placing referrals.    Ellouise Bollman NP-C [facility] [address], [address] [place], [place] [address] Ph. [phone] Fax [phone]

## 2024-07-21 NOTE — Patient Instructions (Signed)
 It was a pleasure to see you today!  Instructions for you until your next appointment are as follows: Increase the Diazepam  to 1/2 tablet twice per day and 1 tablet at bedtime. Let me know if that does not help with agitation Continue giving other medications as prescribed I will send in an order to [employer] for a shower chair Please sign up for MyChart if you have not done so. Please plan to return for follow up in 1 year or sooner if needed.  Feel free to contact our office during normal business hours at [phone] with questions or concerns. If there is no answer or the call is outside business hours, please leave a message and our clinic staff will call you back within the next business day.  If you have an urgent concern, please stay on the line for our after-hours answering service and ask for the on-call neurologist.     I also encourage you to use MyChart to communicate with me more directly. If you have not yet signed up for MyChart within [facility], the front desk staff can help you. However, please note that this inbox is NOT monitored on nights or weekends, and response can take up to 2 business days.  Urgent matters should be discussed with the on-call pediatric neurologist.   At [facility], we are committed to providing exceptional care. You will receive a patient satisfaction survey through text or email regarding your visit today. Your opinion is important to me. Comments are appreciated.

## 2024-08-02 NOTE — Progress Notes (Deleted)
 This is a [facility] E-Visit consult/follow up provided via My Chart Video Visit (Caregility). Fonda LITTIE Dellis Christian Preston and his mother Partick Musselman consented to an E-Visit consult today.  Is the patient present for the video visit? Yes Location of patient: Christian Preston is at home (location) Is the patient located in the state of [place] ? Yes Location of provider: Ellouise Bollman, MD is at office Patient was referred by Tanda Prentice DEL, MD   The following participants were involved in this E-Visit: CMA, NP, patient's mother (list of participants and their roles)  This visit was done via VIDEO   Chief Complain/ Reason for E-Visit today: *** Total time on call: *** Follow up: ***   Christian Preston   MRN:  [id number]  08/10/1996   Provider: Ellouise Bollman NP-C Location of Care: [facility]  Visit type: Return vsiit  Last visit: 07/21/2024  Referral source: Tanda Prentice DEL, MD PCP: Tanda Prentice DEL, MD History from: Epic chart ***  Brief history:  Copied from previous record: History of complex partial seizures with secondary generalization, autism, 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 He is taking and tolerating Trileptal  and has remained seizure free since June 2000. Christian Preston has ongoing problems mood, low frustration tolerance, and self directed aggression. Lexapro  has helped with his mood and he has Diazepam  for bouts of agitation   Due to his medical condition, Christian Preston is indefinitely incontinent of stool and urine.  It is medically necessary for him to use diapers, underpads, and gloves to assist with hygiene and skin integrity.     Since last visit:   Shanon has been otherwise generally healthy since he was last seen. No health concerns today other than previously mentioned.  Review of  systems: Please see HPI for neurologic and other pertinent review of systems. Otherwise all other systems were reviewed and were negative.  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   Family history: family history includes Diabetes in his paternal grandmother; Heart Problems in his paternal grandmother.   Social history: Social History   Socioeconomic History   Marital status: Single    Spouse name: Not on file   Number of children: Not on file   Years of education: Not on file   Highest education level: Not on file  Occupational History   Not on file  Tobacco Use   Smoking status: Never   Smokeless tobacco: Never  Vaping Use   Vaping status: Never Used  Substance  and Sexual Activity   Alcohol use: No   Drug use: No   Sexual activity: Never  Other Topics Concern   Not on file  Social History Narrative   Christian Preston is part of [employer] off and on.   He enjoys movies and music. He lives with his parents.    Social Drivers of Health   Tobacco Use: Low Risk (07/21/2024)   Patient  History    Smoking Tobacco Use: Never    Smokeless Tobacco Use: Never    Passive Exposure: Not on file  Financial Resource Strain: Not on file  Food Insecurity: Low Risk (05/06/2024)   Received from [facility]   Epic    Within the past 12 months, you worried that your food would run out before you got money to buy more: Never true    Within the past 12 months, the food you bought just didn't last and you didn't have money to get more. : Never true  Transportation Needs: No Transportation Needs (05/06/2024)   Received from [employer]    In the past 12 months, has lack of reliable transportation kept you from medical appointments, meetings, work or from getting things needed for daily living? : No  Physical Activity: Not on file  Stress: Not on file  Social Connections: Not on file  Intimate Partner Violence: Not on file  Depression (EYV7-0): Not on file  Alcohol Screen: Not on file  Housing: Low Risk (05/06/2024)   Received from [facility]   Epic    What is your living situation today?: I have a steady place to live    Think about the place you live. Do you have problems with any of the following? Choose all that apply:: None/None on this list  Utilities: Low Risk (05/06/2024)   Received from [facility]   Utilities    In the past 12 months has the electric, gas, oil, or water company threatened to shut off services in your home? : No  Health Literacy: Not on file    Past/failed meds: Copied from previous record: Diazepam  suspension - did not like the taste Depakote - toxicity and worsening seizures Fluoxetine - increased OCD behaviors Carbamazepine - changes in behavior  Allergies: Allergies[1]    Immunizations: Immunization History  Administered Date(s) Administered   Influenza Split 06/22/2014   Influenza, Seasonal, Injecte, Preservative Fre 05/11/2013   Influenza,inj,Quad PF,6+ Mos 06/18/2017, 09/03/2018, 05/13/2019, 08/02/2020    Influenza-Unspecified 07/21/2015, 06/11/2016   PFIZER(Purple Top)SARS-COV-2 Vaccination 11/19/2019, 12/11/2019    Diagnostics/Screenings: Copied from previous record: MRI Brain wo contrast- 09-10-[facility] - Subtle confluent area of abnormal increased T2 signal in the left periventricular white matter adjacent to the atrium and occipital horn of the left lateral ventricle. Fairly normal myelination pattern seen in the motor strips bilaterally. The remainder of the myelination pattern, the frontal and parietal lobes is markedly delayed for patient's age. The myelination pattern correlates to a  patient of less than 1 year of age. Small right maxillary sinus mucous retention cyst. Mild bilateral cerebral atrophy more than anticipated for patient's age. No distinct intracranial lesions. The medial temporal lobes and hippocampi are otherwise normal. The CSF pathways and ventricles are normal. The remainder of the brain parenchyma is normal. The calvarium is normal. The orbital structures are normal.  The intracranial  flow voids are normal. No areas of restricted diffusion on the DWI sequence to suggest an acute infarct.  Impression: Markedly delayed white matter  myelination and volume loss but no focal intracranial lesion to explain seizures.   Physical Exam: There were no vitals taken for this visit.  General: well developed, well nourished, seated, in no evident distress Head: normocephalic and atraumatic. No dysmorphic features. Neck: supple Musculoskeletal: No skeletal deformities or obvious scoliosis Skin: no rashes or neurocutaneous lesions  Neurologic Exam Mental Status: Awake and fully alert.  Attention span, concentration, and fund of knowledge appropriate for age.  Speech fluent without dysarthria.  Able to follow commands and participate in examination. Cranial Nerves: Turns to localize faces, objects and sounds in the periphery. Facial sensation intact.  Face, tongue, palate  move normally and symmetrically. Motor: Normal functional bulk, tone and strength Sensory: Intact to touch and temperature in all extremities. Coordination: Finger-to-nose and heel-to-shin intact bilaterally. Balance adequate Gait and Station: Arises from chair, without difficulty. Stance is normal.  Gait demonstrates normal stride length and balance.   Impression: No diagnosis found.    Recommendations for plan of care: The patient's previous Epic records were reviewed. No recent diagnostic studies to be reviewed with the patient.   Recommendations and plan until next visit: Continue medications as prescribed  Call for questions or concerns No follow-ups on file.  The medication list was reviewed and reconciled. No changes were made in the prescribed medications today. A complete medication list was provided to the patient.  No orders of the defined types were placed in this encounter.    Allergies as of 08/03/2024       Reactions   Depakote [divalproex Sodium] Other (See Comments)   Toxicity and worsening seizures - occurred in childhood - prior to starting Trileptal  in 2001   Prozac [fluoxetine Hcl] Other (See Comments)   Increase in obsessive compulsive behavior   Carbamazepine, Eslicarbazepine, And Oxcarbazepine     Other reaction(s): Unknown   Chicken Allergy    Other reaction(s): Unknown   Egg Protein-containing Drug Products    Other reaction(s): Unknown        Medication List        Accurate as of August 02, 2024  2:09 PM. If you have any questions, ask your nurse or doctor.          diazepam  5 MG tablet Commonly known as: VALIUM  Take 1/2 tablet in the morning and 1/2 tablet in the evening for anxiety. Also take 1 tablet at bedtime for sleep   escitalopram  10 MG tablet Commonly known as: LEXAPRO  Take 1 tablet (10 mg total) by mouth daily.   omeprazole  20 MG capsule Commonly known as: PRILOSEC Take 1 capsule (20 mg total) by mouth 2 (two) times  daily before a meal.   Trileptal  300 MG tablet Generic drug: Oxcarbazepine  TAKE 1 AND 1/2 TABLETS BY MOUTH TWICE A DAY         I discussed this patient's care with the multiple providers involved in his care today to develop this assessment and plan.  I spent *** minutes caring for the patient today face to face reviewing records, including previous charts and test results, examination of the patient, discussion and education with the parent/caregiver about his condition, documentation in his chart, developing a plan of care and ordering/placing referrals.  Ellouise Bollman NP-C [facility] [address], [address] [place], [place] [address] Ph. [phone] Fax [phone]          [1]  Allergies Allergen Reactions   Depakote [Divalproex Sodium] Other (See Comments)    Toxicity  and worsening seizures - occurred in childhood - prior to starting Trileptal  in 2001   Prozac [Fluoxetine Hcl] Other (See Comments)    Increase in obsessive compulsive behavior   Carbamazepine, Eslicarbazepine, And Oxcarbazepine      Other reaction(s): Unknown   Chicken Allergy     Other reaction(s): Unknown   Egg Protein-Containing Drug Products     Other reaction(s): Unknown

## 2024-08-03 ENCOUNTER — Telehealth (INDEPENDENT_AMBULATORY_CARE_PROVIDER_SITE_OTHER): Admitting: Family

## 2025-08-03 ENCOUNTER — Telehealth (INDEPENDENT_AMBULATORY_CARE_PROVIDER_SITE_OTHER): Admitting: Family
